# Patient Record
Sex: Male | Born: 2000
Health system: Southern US, Community
[De-identification: ages and names within clinical notes are randomized; demographics above are authoritative.]

## PROBLEM LIST (undated history)

## (undated) DIAGNOSIS — Z9109 Other allergy status, other than to drugs and biological substances: Secondary | ICD-10-CM

## (undated) DIAGNOSIS — K5190 Ulcerative colitis, unspecified, without complications: Secondary | ICD-10-CM

## (undated) DIAGNOSIS — J45909 Unspecified asthma, uncomplicated: Secondary | ICD-10-CM

## (undated) DIAGNOSIS — F32A Depression, unspecified: Secondary | ICD-10-CM

## (undated) DIAGNOSIS — K589 Irritable bowel syndrome without diarrhea: Secondary | ICD-10-CM

## (undated) DIAGNOSIS — F429 Obsessive-compulsive disorder, unspecified: Secondary | ICD-10-CM

## (undated) DIAGNOSIS — F329 Major depressive disorder, single episode, unspecified: Secondary | ICD-10-CM

## (undated) DIAGNOSIS — K200 Eosinophilic esophagitis: Secondary | ICD-10-CM

## (undated) DIAGNOSIS — F419 Anxiety disorder, unspecified: Secondary | ICD-10-CM

## (undated) DIAGNOSIS — M419 Scoliosis, unspecified: Secondary | ICD-10-CM

## (undated) HISTORY — PX: COLONOSCOPY, ESOPHAGOGASTRODUODENOSCOPY (EGD) AND ESOPHAGEAL DILATION: SHX5781

## (undated) HISTORY — DX: Eosinophilic esophagitis: K20.0

---
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
## 2022-01-01 ENCOUNTER — Telehealth: Payer: Self-pay

## 2022-01-01 ENCOUNTER — Encounter: Payer: Self-pay | Admitting: Gastroenterology

## 2022-01-01 LAB — CBC WITH DIFFERENTIAL/PLATELET
Basophils Absolute: 0 10*3/uL (ref 0.0–0.2)
Basos: 0 %
EOS (ABSOLUTE): 0.1 10*3/uL (ref 0.0–0.4)
Eos: 1 %
Hematocrit: 46 % (ref 37.5–51.0)
Hemoglobin: 16.4 g/dL (ref 13.0–17.7)
Immature Grans (Abs): 0 10*3/uL (ref 0.0–0.1)
Immature Granulocytes: 0 %
Lymphocytes Absolute: 1.9 10*3/uL (ref 0.7–3.1)
Lymphs: 20 %
MCH: 31.9 pg (ref 26.6–33.0)
MCHC: 35.7 g/dL (ref 31.5–35.7)
MCV: 90 fL (ref 79–97)
Monocytes Absolute: 0.4 10*3/uL (ref 0.1–0.9)
Monocytes: 5 %
Neutrophils Absolute: 6.9 10*3/uL (ref 1.4–7.0)
Neutrophils: 74 %
Platelets: 327 10*3/uL (ref 150–450)
RBC: 5.14 x10E6/uL (ref 4.14–5.80)
RDW: 11.5 % — ABNORMAL LOW (ref 11.6–15.4)
WBC: 9.3 10*3/uL (ref 3.4–10.8)

## 2022-01-01 LAB — QUANTIFERON-TB GOLD PLUS
QuantiFERON Mitogen Value: >10 IU/mL
QuantiFERON Nil Value: 0.05 IU/mL
QuantiFERON TB1 Ag Value: 0.09 IU/mL
QuantiFERON TB2 Ag Value: 0.12 IU/mL
QuantiFERON-TB Gold Plus: NEGATIVE

## 2022-01-01 LAB — HEPATITIS C ANTIBODY: Hep C Virus Ab: NONREACTIVE

## 2022-01-01 LAB — C-REACTIVE PROTEIN: CRP: 1 mg/L (ref 0–10)

## 2022-01-01 LAB — VITAMIN D 1,25 DIHYDROXY
Vitamin D 1, 25 (OH)2 Total: 58 pg/mL
Vitamin D2 1, 25 (OH)2: <10 pg/mL
Vitamin D3 1, 25 (OH)2: 53 pg/mL

## 2022-01-01 LAB — HEPATITIS A ANTIBODY, TOTAL: hep A Total Ab: POSITIVE — AB

## 2022-01-01 LAB — HEPATITIS B SURFACE ANTIGEN: Hepatitis B Surface Ag: NEGATIVE

## 2022-01-01 LAB — HEPATITIS B SURFACE ANTIBODY,QUALITATIVE: Hep B Surface Ab, Qual: NONREACTIVE

## 2022-01-01 LAB — HEPATITIS B E ANTIBODY: Hep B E Ab: NEGATIVE

## 2022-01-01 LAB — HEPATITIS B CORE ANTIBODY, TOTAL: Hep B Core Total Ab: NEGATIVE

## 2022-01-01 LAB — HIV ANTIBODY (ROUTINE TESTING W REFLEX): HIV Screen 4th Generation wRfx: NONREACTIVE

## 2022-01-01 LAB — HEPATITIS B E ANTIGEN: Hep B E Ag: NEGATIVE

## 2022-01-01 NOTE — Telephone Encounter (Signed)
-----   Message from Jonathon Bellows, MD sent at 01/01/2022 10:51 AM EDT ----- ?Needs hep b vaccine ?

## 2022-01-01 NOTE — Telephone Encounter (Signed)
Sent mychart message

## 2022-01-01 NOTE — Progress Notes (Signed)
Ok

## 2022-01-06 ENCOUNTER — Telehealth: Payer: Self-pay | Admitting: Gastroenterology

## 2022-01-06 NOTE — Telephone Encounter (Signed)
Patient calling to schedule colonoscopy. Requesting call back.  

## 2022-01-07 ENCOUNTER — Ambulatory Visit (HOSPITAL_BASED_OUTPATIENT_CLINIC_OR_DEPARTMENT_OTHER)
Admission: RE | Admit: 2022-01-07 | Discharge: 2022-01-07 | Disposition: A | Discharge status: Home / Self Care | Payer: 59 | Source: Ambulatory Visit | Attending: Gastroenterology | Admitting: Gastroenterology

## 2022-01-07 ENCOUNTER — Other Ambulatory Visit: Payer: Self-pay

## 2022-01-07 ENCOUNTER — Inpatient Hospital Stay (HOSPITAL_BASED_OUTPATIENT_CLINIC_OR_DEPARTMENT_OTHER): Admission: RE | Admit: 2022-01-07 | Payer: 59 | Source: Ambulatory Visit

## 2022-01-07 DIAGNOSIS — Z7952 Long term (current) use of systemic steroids: Secondary | ICD-10-CM | POA: Insufficient documentation

## 2022-01-07 DIAGNOSIS — Z8719 Personal history of other diseases of the digestive system: Secondary | ICD-10-CM | POA: Insufficient documentation

## 2022-01-07 DIAGNOSIS — Z13820 Encounter for screening for osteoporosis: Secondary | ICD-10-CM | POA: Diagnosis not present

## 2022-01-07 DIAGNOSIS — M8588 Other specified disorders of bone density and structure, other site: Secondary | ICD-10-CM | POA: Diagnosis not present

## 2022-01-08 ENCOUNTER — Other Ambulatory Visit: Payer: Self-pay

## 2022-01-08 ENCOUNTER — Other Ambulatory Visit (HOSPITAL_COMMUNITY): Payer: Self-pay

## 2022-01-08 ENCOUNTER — Telehealth: Payer: Self-pay

## 2022-01-08 DIAGNOSIS — Z8719 Personal history of other diseases of the digestive system: Secondary | ICD-10-CM

## 2022-01-08 MED ORDER — NA SULFATE-K SULFATE-MG SULF 17.5-3.13-1.6 GM/177ML PO SOLN
354.0000 mL | Freq: Once | ORAL | 0 refills | Status: AC
  Filled 2022-01-08: qty 354, 1d supply, fill #0

## 2022-01-08 NOTE — Telephone Encounter (Signed)
Called patient back and schedule colonoscopy with Dr. Vicente Males on 01/20/22. Went over instruction, sent to [employer], and mailed them. Sent prep to the pharmacy. Patient states he will get Hepatitis B vaccine a PCP  ?

## 2022-01-08 NOTE — Addendum Note (Signed)
Addended by: Ulyess Blossom L on: 01/08/2022 10:11 AM ? ? Modules accepted: Orders ? ?

## 2022-01-08 NOTE — Telephone Encounter (Signed)
Informed patient by mychart  ?

## 2022-01-08 NOTE — Progress Notes (Signed)
Normal

## 2022-01-08 NOTE — Telephone Encounter (Signed)
-----   Message from Jonathon Bellows, MD sent at 01/08/2022 12:18 PM EDT ----- ?Normal  ?

## 2022-01-19 ENCOUNTER — Other Ambulatory Visit (HOSPITAL_COMMUNITY): Payer: Self-pay

## 2022-01-20 ENCOUNTER — Encounter
Admission: RE | Disposition: A | Discharge status: Home / Self Care | Payer: Self-pay | Source: Home / Self Care | Attending: Gastroenterology

## 2022-01-20 ENCOUNTER — Ambulatory Visit: Payer: 59 | Admitting: Anesthesiology

## 2022-01-20 ENCOUNTER — Ambulatory Visit
Admission: RE | Admit: 2022-01-20 | Discharge: 2022-01-20 | Disposition: A | Discharge status: Home / Self Care | Payer: 59 | Attending: Gastroenterology | Admitting: Gastroenterology

## 2022-01-20 ENCOUNTER — Encounter: Payer: Self-pay | Admitting: Gastroenterology

## 2022-01-20 DIAGNOSIS — M419 Scoliosis, unspecified: Secondary | ICD-10-CM | POA: Insufficient documentation

## 2022-01-20 DIAGNOSIS — D122 Benign neoplasm of ascending colon: Secondary | ICD-10-CM | POA: Insufficient documentation

## 2022-01-20 DIAGNOSIS — K635 Polyp of colon: Secondary | ICD-10-CM

## 2022-01-20 DIAGNOSIS — J45909 Unspecified asthma, uncomplicated: Secondary | ICD-10-CM | POA: Diagnosis not present

## 2022-01-20 DIAGNOSIS — Z8719 Personal history of other diseases of the digestive system: Secondary | ICD-10-CM | POA: Diagnosis not present

## 2022-01-20 DIAGNOSIS — R609 Edema, unspecified: Secondary | ICD-10-CM | POA: Diagnosis not present

## 2022-01-20 DIAGNOSIS — K51919 Ulcerative colitis, unspecified with unspecified complications: Secondary | ICD-10-CM | POA: Diagnosis not present

## 2022-01-20 DIAGNOSIS — F418 Other specified anxiety disorders: Secondary | ICD-10-CM | POA: Diagnosis not present

## 2022-01-20 DIAGNOSIS — Z09 Encounter for follow-up examination after completed treatment for conditions other than malignant neoplasm: Secondary | ICD-10-CM | POA: Insufficient documentation

## 2022-01-20 HISTORY — PX: COLONOSCOPY WITH PROPOFOL: SHX5780

## 2022-01-20 SURGERY — COLONOSCOPY WITH PROPOFOL
Anesthesia: General

## 2022-01-20 MED ORDER — PROPOFOL 500 MG/50ML IV EMUL
INTRAVENOUS | Status: DC | PRN
  Administered 2022-01-20: 200 ug/kg/min via INTRAVENOUS

## 2022-01-20 MED ORDER — GLYCOPYRROLATE 0.2 MG/ML IJ SOLN
INTRAMUSCULAR | Status: DC | PRN
  Administered 2022-01-20 (×2): .1 mg via INTRAVENOUS

## 2022-01-20 MED ORDER — LIDOCAINE HCL (CARDIAC) PF 100 MG/5ML IV SOSY
PREFILLED_SYRINGE | INTRAVENOUS | Status: DC | PRN
Start: 2022-01-20 — End: 2022-01-20
  Administered 2022-01-20: 100 mg via INTRAVENOUS

## 2022-01-20 MED ORDER — DEXMEDETOMIDINE HCL 200 MCG/2ML IV SOLN
INTRAVENOUS | Status: DC | PRN
Start: 2022-01-20 — End: 2022-01-20
  Administered 2022-01-20: 20 ug via INTRAVENOUS

## 2022-01-20 MED ORDER — PHENYLEPHRINE HCL (PRESSORS) 10 MG/ML IV SOLN
INTRAVENOUS | Status: DC | PRN
Start: 2022-01-20 — End: 2022-01-20
  Administered 2022-01-20 (×2): 160 ug via INTRAVENOUS

## 2022-01-20 MED ORDER — SODIUM CHLORIDE 0.9 % IV SOLN: INTRAVENOUS | Status: DC

## 2022-01-20 MED ORDER — PROPOFOL 10 MG/ML IV BOLUS
INTRAVENOUS | Status: DC | PRN
  Administered 2022-01-20: 10 mg via INTRAVENOUS
  Administered 2022-01-20: 20 mg via INTRAVENOUS
  Administered 2022-01-20: 90 mg via INTRAVENOUS

## 2022-01-20 NOTE — Transfer of Care (Signed)
Immediate Anesthesia Transfer of Care Note ? ?Patient: Christopher Clarke ? ?Procedure(s) Performed: COLONOSCOPY WITH PROPOFOL ? ?Patient Location: Endoscopy Unit ? ?Anesthesia Type:General ? ?Level of Consciousness: drowsy ? ?Airway & Oxygen Therapy: Patient Spontanous Breathing ? ?Post-op Assessment: Report given to RN and Post -op Vital signs reviewed and stable ? ?Post vital signs: Reviewed and stable ? ?Last Vitals:  ?Vitals Value Taken Time  ?BP 95/58 01/20/22 0912  ?Temp    ?Pulse 88 01/20/22 0912  ?Resp 31 01/20/22 0912  ?SpO2 98 % 01/20/22 0912  ?Vitals shown include unvalidated device data. ? ?Last Pain:  ?Vitals:  ? 01/20/22 0801  ?TempSrc: Temporal  ?PainSc: 0-No pain  ?   ? ?  ? ?Complications: No notable events documented. ?

## 2022-01-20 NOTE — H&P (Signed)
? ? ? ?Jonathon Bellows, MD ?[address] [place], [place], [place], [address] ?[address], [place], [place], [place], [address] ?Phone: [phone]  ?Fax: [phone] ? ?Primary Care Physician:  Pcp, No ? ? ?Pre-Procedure History & Physical: ?HPI:  Christopher Clarke is a 21 y.o. male is here for an colonoscopy. ?  ?Past Medical History:  ?Diagnosis Date  ? Anxiety   ? Asthma   ? Depression   ? Environmental allergies   ? IBS (irritable bowel syndrome)   ? OCD (obsessive compulsive disorder)   ? Scoliosis   ? Ulcerative colitis ([place])   ? ? ?Past Surgical History:  ?Procedure Laterality Date  ? COLONOSCOPY, ESOPHAGOGASTRODUODENOSCOPY (EGD) AND ESOPHAGEAL DILATION    ? ? ?Prior to Admission medications   ?Medication Sig Start Date End Date Taking? Authorizing Provider  ?albuterol (PROVENTIL HFA;VENTOLIN HFA) 108 (90 BASE) MCG/ACT inhaler Inhale 1 puff into the lungs every 6 (six) hours as needed for wheezing or shortness of breath.    [provider]  ?azaTHIOprine (IMURAN) 50 MG tablet Take 50 mg by mouth daily.    [provider]  ?erythromycin ophthalmic ointment Apply 1 inch into right lower eyelid at bedtime for 1 week 12/15/21     ?escitalopram (LEXAPRO) 10 MG tablet Take 15 mg by mouth daily.    [provider]  ?FLUoxetine (PROZAC) 20 MG capsule TAKE 1 CAPSULE BY MOUTH EVERY MORNING FOR DEPRESSION/ANXIETY 12/10/20 12/23/21  Stephannie Peters, [place]  ?folic acid (FOLVITE) 1 MG tablet Take 1 tablet by mouth daily. 12/02/20   [provider]  ?lansoprazole (PREVACID) 30 MG capsule Take 30 mg by mouth daily at 12 noon.    [provider]  ?mesalamine (CANASA) 1000 MG suppository Unwrap and insert 1 suppository (1,000 mg total) rectally at bedtime for 30 days. Must be seen before any further refills can be authorized 09/15/21     ?Mesalamine 800 MG TBEC Take by mouth 3 (three) times daily.    [provider]  ?methylPREDNISolone (MEDROL DOSEPAK) 4 MG TBPK tablet follow  package directions 02/13/21     ?mometasone (NASONEX) 50 MCG/ACT nasal spray Place 2 sprays into the nose daily.    [provider]  ?montelukast (SINGULAIR) 5 MG chewable tablet Chew 5 mg by mouth daily. 06/04/15   [provider]  ?ofloxacin (OCUFLOX) 0.3 % ophthalmic solution Place 1 drop into the right eye 4 (four) times daily for 4 days 12/15/21     ?Omega 3 1000 MG CAPS Take 2 g by mouth daily.    [provider]  ? ? ?Allergies as of 01/08/2022 - Review Complete 12/23/2021  ?Allergen Reaction Noted  ? Gluten meal  09/15/2016  ? Lac bovis  09/15/2016  ? Milk-related compounds  01/02/2018  ? Other  10/25/2020  ? Shellfish-derived products  09/15/2021  ? Soy allergy  09/15/2021  ? ? ?History reviewed. No pertinent family history. ? ?Social History  ? ?Socioeconomic History  ? Marital status: Single  ?  Spouse name: Not on file  ? Number of children: Not on file  ? Years of education: Not on file  ? Highest education level: Not on file  ?Occupational History  ? Not on file  ?Tobacco Use  ? Smoking status: Never  ? Smokeless tobacco: Never  ?Vaping Use  ? Vaping Use: Never used  ?Substance and Sexual Activity  ? Alcohol use: Never  ? Drug use: Never  ? Sexual activity: Not on file  ?  Other Topics Concern  ? Not on file  ?Social History Narrative  ? Not on file  ? ?Social Determinants of Health  ? ?Financial Resource Strain: Not on file  ?Food Insecurity: Not on file  ?Transportation Needs: Not on file  ?Physical Activity: Not on file  ?Stress: Not on file  ?Social Connections: Not on file  ?Intimate Partner Violence: Not on file  ? ? ?Review of Systems: ?See HPI, otherwise negative ROS ? ?Physical Exam: ?BP 114/76   Pulse 86   Temp (!) 97.5 ?F (36.4 ?C) (Temporal)   Resp 18   Ht 5' 8.75" (1.746 m)   Wt 64.4 kg   SpO2 100%   BMI 21.12 kg/m?  ?General:   Alert,  pleasant and cooperative in NAD ?Head:  Normocephalic and atraumatic. ?Neck:  Supple; no masses or thyromegaly. ?Lungs:  Clear  throughout to auscultation, normal respiratory effort.    ?Heart:  +S1, +S2, Regular rate and rhythm, No edema. ?Abdomen:  Soft, nontender and nondistended. Normal bowel sounds, without guarding, and without rebound.   ?Neurologic:  Alert and  oriented x4;  grossly normal neurologically. ? ?Impression/Plan: ?Christopher Clarke is here for an colonoscopy to be performed for ulcerative colitis.  ?Risks, benefits, limitations, and alternatives regarding  colonoscopy have been reviewed with the patient.  Questions have been answered.  All parties agreeable. ? ? ?Jonathon Bellows, MD  01/20/2022, 8:42 AM ? ?

## 2022-01-20 NOTE — Op Note (Signed)
[facility] ?Gastroenterology ?Patient Name: Christopher Clarke ?Procedure Date: 01/20/2022 8:45 AM ?MRN: [id number] ?Account #: [id number] ?Date of Birth: 02/14/2001 ?Admit Type: Outpatient ?Age: 21 ?Room: [facility] ENDO ROOM 2 ?Gender: Male ?Note Status: Finalized ?Instrument Name: Colonoscope [id number] ?Procedure:             Colonoscopy ?Indications:           Follow-up of ulcerative colitis ?Providers:             Jonathon Bellows MD, MD ?Referring MD:          [facility] (Referring MD) ?Medicines:             Monitored Anesthesia Care ?Complications:         No immediate complications. ?Procedure:             Pre-Anesthesia Assessment: ?                       - Prior to the procedure, a History and Physical was  ?                       performed, and patient medications, allergies and  ?                       sensitivities were reviewed. The patient's tolerance  ?                       of previous anesthesia was reviewed. ?                       - The risks and benefits of the procedure and the  ?                       sedation options and risks were discussed with the  ?                       patient. All questions were answered and informed  ?                       consent was obtained. ?                       - ASA Grade Assessment: II - A patient with mild  ?                       systemic disease. ?                       After obtaining informed consent, the colonoscope was  ?                       passed under direct vision. Throughout the procedure,  ?                       the patient's blood pressure, pulse, and oxygen  ?                       saturations were monitored continuously. The  ?                       Colonoscope was introduced  through the anus and  ?                       advanced to the the cecum, identified by the  ?                       appendiceal orifice. The colonoscopy was performed  ?                       with ease. The patient tolerated the procedure well.  ?                        The quality of the bowel preparation was fair. ?Findings: ?     The perianal and digital rectal examinations were normal. ?     A 8 mm polyp was found in the ascending colon. The polyp was sessile.  ?     The polyp was removed with a cold snare. Resection and retrieval were  ?     complete. ?     Localized moderate inflammation characterized by congestion (edema) and  ?     erythema was found in the distal rectum. Biopsies were taken with a cold  ?     forceps for histology. ?     The exam was otherwise without abnormality on direct and retroflexion  ?     views. ?Impression:            - Preparation of the colon was fair. ?                       - One 8 mm polyp in the ascending colon, removed with  ?                       a cold snare. Resected and retrieved. ?                       - Localized moderate inflammation was found in the  ?                       distal rectum secondary to colitis. Biopsied. ?                       - The examination was otherwise normal on direct and  ?                       retroflexion views. ?Recommendation:        - Discharge patient to home (with escort). ?                       - Resume previous diet. ?                       - Continue present medications. ?                       - Await pathology results. ?                       - Return to my office in 4 weeks. ?Procedure Code(s):     --- Professional --- ?  45385, Colonoscopy, flexible; with removal of  ?                       tumor(s), polyp(s), or other lesion(s) by snare  ?                       technique ?                       45380, 59, Colonoscopy, flexible; with biopsy, single  ?                       or multiple ?Diagnosis Code(s):     --- Professional --- ?                       K63.5, Polyp of colon ?                       K52.9, Noninfective gastroenteritis and colitis,  ?                       unspecified ?                       K51.90, Ulcerative colitis, unspecified, without  ?                        complications ?CPT copyright 2019 American Medical Association. All rights reserved. ?The codes documented in this report are preliminary and upon coder review may  ?be revised to meet current compliance requirements. ?Jonathon Bellows, MD ?Jonathon Bellows MD, MD ?01/20/2022 9:11:18 AM ?This report has been signed electronically. ?Number of Addenda: 0 ?Note Initiated On: 01/20/2022 8:45 AM ?Scope Withdrawal Time: 0 hours 10 minutes 43 seconds  ?Total Procedure Duration: 0 hours 14 minutes 46 seconds  ?Estimated Blood Loss:  Estimated blood loss: none. ?     [facility] ?

## 2022-01-20 NOTE — Anesthesia Postprocedure Evaluation (Signed)
Anesthesia Post Note ? ?Patient: Christopher Clarke ? ?Procedure(s) Performed: COLONOSCOPY WITH PROPOFOL ? ?Patient location during evaluation: Endoscopy ?Anesthesia Type: General ?Level of consciousness: awake and alert ?Pain management: pain level controlled ?Vital Signs Assessment: post-procedure vital signs reviewed and stable ?Respiratory status: spontaneous breathing, nonlabored ventilation, respiratory function stable and patient connected to nasal cannula oxygen ?Cardiovascular status: blood pressure returned to baseline and stable ?Postop Assessment: no apparent nausea or vomiting ?Anesthetic complications: no ? ? ?No notable events documented. ? ? ?Last Vitals:  ?Vitals:  ? 01/20/22 0930 01/20/22 0940  ?BP: (!) 98/58 97/75  ?Pulse: 60 67  ?Resp: 17 18  ?Temp:    ?SpO2: 100% 100%  ?  ?Last Pain:  ?Vitals:  ? 01/20/22 0910  ?TempSrc: Temporal  ?PainSc:   ? ? ?  ?  ?  ?  ?  ?  ? ?Martha Clan ? ? ? ? ?

## 2022-01-20 NOTE — Anesthesia Preprocedure Evaluation (Signed)
Anesthesia Evaluation  ?Patient identified by MRN, date of birth, ID band ?Patient awake ? ? ? ?Reviewed: ?Allergy & Precautions, H&P , NPO status , Patient's Chart, lab work & pertinent test results, reviewed documented beta blocker date and time  ? ?History of Anesthesia Complications ?Negative for: history of anesthetic complications ? ?Airway ?Mallampati: II ? ?TM Distance: >3 FB ?Neck ROM: full ? ? ? Dental ? ?(+) Dental Advidsory Given, Chipped, Teeth Intact ?  ?Pulmonary ?neg shortness of breath, asthma , neg recent URI,  ?  ?Pulmonary exam normal ?breath sounds clear to auscultation ? ? ? ? ? ? Cardiovascular ?Exercise Tolerance: Good ?negative cardio ROS ?Normal cardiovascular exam ?Rhythm:regular Rate:Normal ? ? ?  ?Neuro/Psych ?PSYCHIATRIC DISORDERS Anxiety Depression negative neurological ROS ?   ? GI/Hepatic ?Neg liver ROS, Ulcerative colitis ?  ?Endo/Other  ?negative endocrine ROS ? Renal/GU ?negative Renal ROS  ?negative genitourinary ?  ?Musculoskeletal ? ? Abdominal ?  ?Peds ? Hematology ?negative hematology ROS ?(+)   ?Anesthesia Other Findings ?Past Medical History: ?No date: Anxiety ?No date: Asthma ?No date: Depression ?No date: Environmental allergies ?No date: IBS (irritable bowel syndrome) ?No date: OCD (obsessive compulsive disorder) ?No date: Scoliosis ?No date: Ulcerative colitis ([place]) ? ? Reproductive/Obstetrics ?negative OB ROS ? ?  ? ? ? ? ? ? ? ? ? ? ? ? ? ?  ?  ? ? ? ? ? ? ? ? ?Anesthesia Physical ?Anesthesia Plan ? ?ASA: 2 ? ?Anesthesia Plan: General  ? ?Post-op Pain Management:   ? ?Induction: Intravenous ? ?PONV Risk Score and Plan: 2 and Propofol infusion and TIVA ? ?Airway Management Planned: Natural Airway and Nasal Cannula ? ?Additional Equipment:  ? ?Intra-op Plan:  ? ?Post-operative Plan:  ? ?Informed Consent: I have reviewed the patients History and Physical, chart, labs and discussed the procedure including the risks, benefits and  alternatives for the proposed anesthesia with the patient or authorized representative who has indicated his/her understanding and acceptance.  ? ? ? ?Dental Advisory Given ? ?Plan Discussed with: Anesthesiologist, CRNA and Surgeon ? ?Anesthesia Plan Comments:   ? ? ? ? ? ? ?Anesthesia Quick Evaluation ? ?

## 2022-01-21 ENCOUNTER — Encounter: Payer: Self-pay | Admitting: Gastroenterology

## 2022-01-21 LAB — SURGICAL PATHOLOGY

## 2022-01-23 ENCOUNTER — Telehealth: Payer: Self-pay

## 2022-01-23 NOTE — Telephone Encounter (Signed)
Called patient to let him know the below information and he agreed. ?

## 2022-01-23 NOTE — Progress Notes (Signed)
Polyp was adenoma- repeat colonoscopy in 7 years. Rectal bx shows colitis in remission- continue present treatment and followup with me as previously scheduled

## 2022-01-23 NOTE — Telephone Encounter (Signed)
-----   Message from Jonathon Bellows, MD sent at 01/23/2022  8:29 AM EDT ----- ?Polyp was adenoma- repeat colonoscopy in 7 years. Rectal bx shows colitis in remission- continue present treatment and followup with me as previously scheduled  ?

## 2022-02-12 ENCOUNTER — Other Ambulatory Visit: Payer: 59

## 2022-02-14 ENCOUNTER — Other Ambulatory Visit (HOSPITAL_COMMUNITY): Payer: Self-pay

## 2022-02-16 ENCOUNTER — Other Ambulatory Visit (HOSPITAL_COMMUNITY): Payer: Self-pay

## 2022-02-17 ENCOUNTER — Other Ambulatory Visit (HOSPITAL_COMMUNITY): Payer: Self-pay

## 2022-03-23 ENCOUNTER — Telehealth: Payer: 59 | Admitting: Gastroenterology

## 2022-05-27 ENCOUNTER — Other Ambulatory Visit (HOSPITAL_COMMUNITY): Payer: Self-pay

## 2022-05-28 ENCOUNTER — Other Ambulatory Visit (HOSPITAL_COMMUNITY): Payer: Self-pay

## 2022-06-05 ENCOUNTER — Other Ambulatory Visit (HOSPITAL_COMMUNITY): Payer: Self-pay

## 2022-06-05 ENCOUNTER — Encounter (HOSPITAL_COMMUNITY): Payer: Self-pay | Admitting: Pharmacist

## 2022-06-06 ENCOUNTER — Other Ambulatory Visit (HOSPITAL_COMMUNITY): Payer: Self-pay

## 2022-06-10 ENCOUNTER — Other Ambulatory Visit (HOSPITAL_COMMUNITY): Payer: Self-pay

## 2022-06-27 ENCOUNTER — Ambulatory Visit (INDEPENDENT_AMBULATORY_CARE_PROVIDER_SITE_OTHER): Payer: 59

## 2022-06-27 ENCOUNTER — Ambulatory Visit
Admission: RE | Admit: 2022-06-27 | Discharge: 2022-06-27 | Disposition: A | Discharge status: Home / Self Care | Payer: 59 | Source: Ambulatory Visit | Attending: Family Medicine | Admitting: Family Medicine

## 2022-06-27 ENCOUNTER — Ambulatory Visit: Payer: 59

## 2022-06-27 VITALS — BP 104/70 | HR 65 | Temp 98.7°F | Resp 18 | Ht 69.0 in | Wt 145.0 lb

## 2022-06-27 DIAGNOSIS — Z8719 Personal history of other diseases of the digestive system: Secondary | ICD-10-CM

## 2022-06-27 DIAGNOSIS — R079 Chest pain, unspecified: Secondary | ICD-10-CM

## 2022-06-27 DIAGNOSIS — R0789 Other chest pain: Secondary | ICD-10-CM | POA: Diagnosis not present

## 2022-06-27 DIAGNOSIS — K5190 Ulcerative colitis, unspecified, without complications: Secondary | ICD-10-CM

## 2022-06-27 NOTE — ED Provider Notes (Signed)
[facility]    CSN: [id number] Arrival date & time: 06/27/22  1452      History   Chief Complaint Chief Complaint  Patient presents with   Heartburn    Mild periodic chest pain that has been occurring for two weeks. - Entered by patient   Appointment    HPI Christopher Clarke is a 21 y.o. male.   HPI  Patient is here for chest pain.  Is been going on for 2 weeks.  It happens randomly throughout the day.  Usually in the afternoon but it happened this morning.  He does not have any associated symptoms, no radiation of pain, no dizziness, no shortness of breath, no jaw pain.  He states that he has never had any heart problems.  He has no hypertension, diabetes, high cholesterol, or family history of heart disease.  Remotely he had a couple uncles that had heart attacks he does not recall their age as being particularly young (under 72).  He does not have any symptoms with exercise. Patient states that he does have a fair amount of stress.  He wonders whether this could be anxiety Patient also states that he does have a history of esophagitis.  Was diagnosed with eosinophilic esophagitis at the age of 63.  Was treated with Prevacid.  He has known ulcerative colitis.  He sees GI medicine every 6 months and has colonoscopy once a year.  He is currently treated with mesalamine suppositories daily with good control of symptoms  Past Medical History:  Diagnosis Date   Anxiety    Asthma    Depression    Environmental allergies    IBS (irritable bowel syndrome)    OCD (obsessive compulsive disorder)    Scoliosis    Ulcerative colitis [facility])     Patient Active Problem List   Diagnosis Date Noted   Acne vulgaris 02/10/2021   Colitis 07/08/2016   Ulcerative colitis without complications ([place]) 28/41/3244   EE (eosinophilic esophagitis) 09/30/7251   Allergy 08/21/2015   Asthma 08/21/2015   Joint pain 08/21/2015   Chronic diarrhea 08/16/2015   Hematochezia 08/16/2015   Lower  abdominal pain 08/16/2015   Microcytic anemia 08/16/2015    Past Surgical History:  Procedure Laterality Date   COLONOSCOPY WITH PROPOFOL N/A 01/20/2022   Procedure: COLONOSCOPY WITH PROPOFOL;  Surgeon: Jonathon Bellows, MD;  Location: [facility] ENDOSCOPY;  Service: Gastroenterology;  Laterality: N/A;   COLONOSCOPY, ESOPHAGOGASTRODUODENOSCOPY (EGD) AND ESOPHAGEAL DILATION         Home Medications    Prior to Admission medications   Medication Sig Start Date End Date Taking? Authorizing Provider  albuterol (PROVENTIL HFA;VENTOLIN HFA) 108 (90 BASE) MCG/ACT inhaler Inhale 1 puff into the lungs every 6 (six) hours as needed for wheezing or shortness of breath.   Yes [provider]  folic acid (FOLVITE) 1 MG tablet Take 1 tablet by mouth daily. 12/02/20  Yes [provider]  mesalamine (CANASA) 1000 MG suppository Unwrap and insert 1 suppository (1,000 mg total) rectally at bedtime . 09/15/21  Yes   mometasone (NASONEX) 50 MCG/ACT nasal spray Place 2 sprays into the nose daily.   Yes [provider]  Omega 3 1000 MG CAPS Take 2 g by mouth daily.   Yes [provider]    Family History History reviewed. No pertinent family history.  Social History Social History   Tobacco Use   Smoking status: Never   Smokeless tobacco: Never  Vaping Use   Vaping  Use: Never used  Substance Use Topics   Alcohol use: Never   Drug use: Never     Allergies   Gluten meal, Milk (cow), Milk-related compounds, Other, Shellfish-derived products, and Soy allergy   Review of Systems Review of Systems See HPI  Physical Exam Triage Vital Signs ED Triage Vitals  Enc Vitals Group     BP 06/27/22 1516 104/70     Pulse Rate 06/27/22 1516 65     Resp 06/27/22 1516 18     Temp 06/27/22 1516 98.7 F (37.1 C)     Temp Source 06/27/22 1516 Oral     SpO2 06/27/22 1516 100 %     Weight 06/27/22 1517 145 lb (65.8 kg)     Height 06/27/22 1517 5' 9"  (1.753 m)     Head  Circumference --      Peak Flow --      Pain Score 06/27/22 1517 0     Pain Loc --      Pain Edu? --      Excl. in [place]? --    No data found.  Updated Vital Signs BP 104/70 (BP Location: Right Arm)   Pulse 65   Temp 98.7 F (37.1 C) (Oral)   Resp 18   Ht 5' 9"  (1.753 m)   Wt 65.8 kg   SpO2 100%   BMI 21.41 kg/m       Physical Exam Constitutional:      General: He is not in acute distress.    Appearance: He is well-developed.  HENT:     Head: Normocephalic and atraumatic.  Eyes:     Conjunctiva/sclera: Conjunctivae normal.     Pupils: Pupils are equal, round, and reactive to light.  Cardiovascular:     Rate and Rhythm: Normal rate and regular rhythm.     Heart sounds: Normal heart sounds.  Pulmonary:     Effort: Pulmonary effort is normal. No respiratory distress.     Breath sounds: Normal breath sounds.  Chest:     Chest wall: No tenderness.  Abdominal:     General: There is no distension.     Palpations: Abdomen is soft.     Tenderness: There is abdominal tenderness.     Comments: Minimal tenderness to deep palpation in the midepigastrium  Musculoskeletal:        General: Normal range of motion.     Cervical back: Normal range of motion.  Skin:    General: Skin is warm and dry.  Neurological:     Mental Status: He is alert.      UC Treatments / Results  Labs (all labs ordered are listed, but only abnormal results are displayed) Labs Reviewed - No data to display  EKG   Radiology DG Chest 2 View  Result Date: 06/27/2022 CLINICAL DATA:  Chest pain for the past 2 weeks. EXAM: CHEST - 2 VIEW COMPARISON:  01/02/2018 FINDINGS: Normal cardiac silhouette and mediastinal contours. No focal parenchymal opacities. No pleural effusion or pneumothorax. No evidence of edema. No acute osseous abnormalities. IMPRESSION: No acute cardiopulmonary disease. Electronically Signed   By: Sandi Mariscal M.D.   On: 06/27/2022 16:00    Procedures ED EKG  Date/Time: 06/27/2022  4:14 PM  Performed by: Raylene Everts, MD Authorized by: Raylene Everts, MD   Previous ECG:    Previous ECG:  Unavailable Interpretation:    Interpretation: normal   Rate:    ECG rate:  60   ECG rate  assessment: normal   Rhythm:    Rhythm: sinus rhythm   Ectopy:    Ectopy: none   QRS:    QRS axis:  Normal   QRS intervals:  Normal   QRS conduction: normal   ST segments:    ST segments:  Normal T waves:    T waves: normal   Q waves:    Abnormal Q-waves: not present   Comments:     Normal EKG.  Discussed with patient  (including critical care time)  Medications Ordered in UC Medications - No data to display  Initial Impression / Assessment and Plan / UC Course  I have reviewed the triage vital signs and the nursing notes.  Pertinent labs & imaging results that were available during my care of the patient were reviewed by me and considered in my medical decision making (see chart for details).    Reviewed that I do not feel is chest pain is cardiogenic.  I think that it is likely from stress.  He had minimal tenderness to deep palpation of the midepigastrium but not enough for me to definitively identify this is GI.  I told him if he has GI distress he could take a couple of weeks of over-the-counter omeprazole.  Discussed finding a primary care doctor for ongoing medical care Final Clinical Impressions(s) / UC Diagnoses   Final diagnoses:  Non-cardiac chest pain  Ulcerative colitis without complications, unspecified location [facility])  History of esophagitis     Discharge Instructions      Try to get into sleep, eat well, and get enough sleep Return here or go to emergency room if you start feeling worse    ED Prescriptions   None    PDMP not reviewed this encounter.   Raylene Everts, MD 06/27/22 [phone]

## 2022-06-27 NOTE — ED Triage Notes (Signed)
Patient c/o on and off chest pain x 2 weeks.  Denies any SOB, nausea or vomiting.  Patient denies any OTC meds.  Denies any cardiac history.

## 2022-06-27 NOTE — Discharge Instructions (Addendum)
Try to get into sleep, eat well, and get enough sleep Return here or go to emergency room if you start feeling worse

## 2022-06-28 ENCOUNTER — Telehealth: Payer: Self-pay | Admitting: Emergency Medicine

## 2022-06-28 NOTE — Telephone Encounter (Signed)
Attempted to contact patient, unable to leave a message due to mailbox being full.

## 2022-09-14 ENCOUNTER — Other Ambulatory Visit: Payer: Self-pay

## 2022-09-15 ENCOUNTER — Other Ambulatory Visit (HOSPITAL_COMMUNITY): Payer: Self-pay

## 2022-09-15 ENCOUNTER — Other Ambulatory Visit: Payer: Self-pay

## 2022-10-30 ENCOUNTER — Other Ambulatory Visit: Payer: Self-pay

## 2022-10-30 ENCOUNTER — Other Ambulatory Visit (HOSPITAL_COMMUNITY): Payer: Self-pay

## 2022-10-30 MED ORDER — MESALAMINE 1000 MG RE SUPP
1000.0000 mg | Freq: Every day | RECTAL | 1 refills | Status: DC
  Filled 2022-10-30: qty 90, fill #0
  Filled 2022-11-01 – 2023-09-29 (×4): qty 90, 90d supply, fill #0

## 2022-11-02 ENCOUNTER — Other Ambulatory Visit (HOSPITAL_COMMUNITY): Payer: Self-pay

## 2022-11-02 ENCOUNTER — Other Ambulatory Visit: Payer: Self-pay

## 2022-11-27 ENCOUNTER — Ambulatory Visit: Payer: Commercial Managed Care - PPO | Admitting: Medical

## 2022-11-27 VITALS — BP 110/66 | HR 68 | Temp 98.0°F | Resp 18 | Ht 70.0 in | Wt 141.0 lb

## 2022-11-27 DIAGNOSIS — J3089 Other allergic rhinitis: Secondary | ICD-10-CM

## 2022-11-27 DIAGNOSIS — J4520 Mild intermittent asthma, uncomplicated: Secondary | ICD-10-CM | POA: Diagnosis not present

## 2022-11-27 DIAGNOSIS — K5190 Ulcerative colitis, unspecified, without complications: Secondary | ICD-10-CM | POA: Diagnosis not present

## 2022-11-27 NOTE — Progress Notes (Signed)
Subjective:    Patient ID: Christopher Clarke, male    DOB: 06/02/2001, 22 y.o.   MRN: [id number]  HPI  Pt in for first time.  Pt states [place]. 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  Regular exercise at gym. Some cardio and weights. Eating moderate healthy.(Girlfriend opinion). Nonsmoker. No alcohol. Occasional sodda intermittently.   Pt has hx of UC. Pt states he gets checked up every 6 months. He states his condition is stable. He states he is off on Fridays but current GI is off on Fridays so he needs new GI MD.  On review last GI note A/P 11-2021.  ssessment and Plan:    Christopher Clarke is a 22 y.o. y/o male has been referred for transfer of care for ulcerative colitis which was initially ulcerative pancolitis been diagnosed at the age of 47 was on Imuran and Lialda for some years and later transferred to only Canasa suppository.  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  Unclear why stepdown therapy was approached despite having active inflammation.     Plan 1.  CMP, CBC, CRP, fecal calprotectin 2.  Colonoscopy with biopsies in all parts of the colon to determine endoscopic status of ulcerative colitis. 3.  If having active disease will need stepping up of therapy and would favor monotherapy with a biologic agent. 4.  Annual skin exam not been done recently we will request 1 since he had been on Imuran for a while history of ulcerative colitis 5.  DEXA bone scan due to history of chronic steroid use 6.  Will address immunization at next visit     Pt did have colonoscopy recently as well.   Impression/Plan: Christopher Clarke is here for an colonoscopy to be performed for ulcerative colitis.  Risks, benefits, limitations, and alternatives regarding  colonoscopy have been reviewed with the patient.  Questions have been answered.  All parties agreeable.  Post procedure pt summarizes told condition conrolled and continue medication.  Follow up in 6 months. Pt is on canasa 1 suppository at bedtime.     Also hx of asthma- last flare 10 year ago. Has active inhaler.  Pt has hx of depression and anxiety. He states stable now. Was on medication about 5 years ago.   Seasonal allergies- year round. He states mild symptoms and typically does not have to take meds.    Review of Systems  Constitutional:  Negative for chills, fatigue and fever.  Respiratory:  Negative for cough, chest tightness, shortness of breath and wheezing.   Cardiovascular:  Negative for chest pain and palpitations.  Gastrointestinal:  Negative for abdominal pain, blood in stool, diarrhea, nausea, rectal pain and vomiting.  Musculoskeletal:  Negative for back pain, gait problem, myalgias and neck pain.  Skin:  Negative for pallor and rash.  Psychiatric/Behavioral:  Negative for behavioral problems and confusion.     Past Medical History:  Diagnosis Date   Anxiety    Asthma    Depression    Environmental allergies    IBS (irritable bowel syndrome)    OCD (obsessive compulsive disorder)    Scoliosis    Ulcerative colitis ([place])      Social History   Socioeconomic History   Marital status: Single    Spouse name: Not on file   Number of children: Not on file   Years of education: Not on file   Highest education level: Not on file  Occupational History  Not on file  Tobacco Use   Smoking status: Never   Smokeless tobacco: Never  Vaping Use   Vaping Use: Never used  Substance and Sexual Activity   Alcohol use: Never   Drug use: Never   Sexual activity: Not on file  Other Topics Concern   Not on file  Social History Narrative   Not on file   Social Determinants of Health   Financial Resource Strain: Not on file  Food Insecurity: Not on file  Transportation Needs: Not on file  Physical Activity: Not on file  Stress: Not on file  Social Connections: Not on file  Intimate Partner Violence: Not on file    Past Surgical  History:  Procedure Laterality Date   COLONOSCOPY WITH PROPOFOL N/A 01/20/2022   Procedure: COLONOSCOPY WITH PROPOFOL;  Surgeon: Jonathon Bellows, MD;  Location: [facility] ENDOSCOPY;  Service: Gastroenterology;  Laterality: N/A;   COLONOSCOPY, ESOPHAGOGASTRODUODENOSCOPY (EGD) AND ESOPHAGEAL DILATION      No family history on file.  Allergies  Allergen Reactions   Gluten Meal     Other reaction(s): GI Upset (intolerance)   Milk (Cow)     Other reaction(s): Other (See Comments) Swelling (EOE)   Milk-Related Compounds    Other     Other reaction(s): Other (See Comments) Pt stated that he allergic to all preservatives   Shellfish-Derived Products     Other reaction(s): GI Upset (intolerance)   Soy Allergy     Other reaction(s): GI Upset (intolerance)    Current Outpatient Medications on File Prior to Visit  Medication Sig Dispense Refill   albuterol (PROVENTIL HFA;VENTOLIN HFA) 108 (90 BASE) MCG/ACT inhaler Inhale 1 puff into the lungs every 6 (six) hours as needed for wheezing or shortness of breath.     folic acid (FOLVITE) 1 MG tablet Take 1 tablet by mouth daily.     mesalamine (CANASA) 1000 MG suppository Unwrap and insert 1 suppository (1,000 mg total) rectally at bedtime . 90 suppository 1   mometasone (NASONEX) 50 MCG/ACT nasal spray Place 2 sprays into the nose daily.     Omega 3 1000 MG CAPS Take 2 g by mouth daily.     No current facility-administered medications on file prior to visit.    BP 110/66   Pulse 68   Temp 98 F (36.7 C)   Resp 18   Ht '5\' 10"'$  (1.778 m)   Wt 141 lb (64 kg)   SpO2 97%   BMI 20.23 kg/m        Objective:   Physical Exam   General Mental Status- Alert. General Appearance- Not in acute distress.   Skin General: Color- Normal Color. Moisture- Normal Moisture.  Neck Carotid Arteries- Normal color. Moisture- Normal Moisture. No carotid bruits. No JVD.  Chest and Lung Exam Auscultation: Breath  Sounds:-Normal.  Cardiovascular Auscultation:Rythm- Regular. Murmurs & Other Heart Sounds:Auscultation of the heart reveals- No Murmurs.  Abdomen Inspection:-Inspeection Normal. Palpation/Percussion:Note:No mass. Palpation and Percussion of the abdomen reveal- Non Tender, Non Distended + BS, no rebound or guarding.  Neurologic Cranial Nerve exam:- CN III-XII intact(No nystagmus), symmetric smile. Strength:- 5/5 equal and symmetric strength both upper and lower extremities.      Assessment & Plan:   Patient Instructions  Ulcerative colitis. Stable with recent colonoscopy. I placed referral to new GI MD. Recommend you call them next week as follow up.  Asthma- flares are very rare. You have inhaler. If flares become more frequent let me now.  Some seasonal allergies(mild  reported not presently)- if significant flares recommend xyzal anithistamine otc and flonase nasal spray.  Follow up once a year wellness exam or sooner if needed.     Mackie Pai PA-C

## 2022-11-27 NOTE — Patient Instructions (Addendum)
Ulcerative colitis. Stable with recent colonoscopy. I placed referral to new GI MD. Recommend you call them next week as follow up.  Asthma- flares are very rare. You have inhaler. If flares become more frequent let me now.  Some seasonal allergies(mild reported not presently)- if significant flares recommend xyzal anithistamine otc and flonase nasal spray.  Follow up once a year wellness exam or sooner if needed.

## 2022-12-26 ENCOUNTER — Telehealth: Payer: Commercial Managed Care - PPO | Admitting: Nurse Practitioner

## 2022-12-26 DIAGNOSIS — J019 Acute sinusitis, unspecified: Secondary | ICD-10-CM

## 2022-12-27 MED ORDER — DOXYCYCLINE HYCLATE 100 MG PO TABS: 100.0000 mg | ORAL_TABLET | Freq: Two times a day (BID) | ORAL | 0 refills | Status: DC

## 2022-12-27 MED ORDER — FLUTICASONE PROPIONATE 50 MCG/ACT NA SUSP: 2.0000 | Freq: Every day | NASAL | 0 refills | Status: DC

## 2022-12-27 MED ORDER — PSEUDOEPH-BROMPHEN-DM 30-2-10 MG/5ML PO SYRP: 5.0000 mL | ORAL_SOLUTION | Freq: Four times a day (QID) | ORAL | 0 refills | Status: DC | PRN

## 2022-12-27 NOTE — Progress Notes (Signed)
I have spent 5 minutes in review of e-visit questionnaire, review and updating patient chart, medical decision making and response to patient.  ° °Tamaj Jurgens W Cola Highfill, NP ° °  °

## 2022-12-27 NOTE — Progress Notes (Signed)

## 2022-12-28 ENCOUNTER — Other Ambulatory Visit: Payer: Self-pay

## 2022-12-28 ENCOUNTER — Other Ambulatory Visit: Payer: Self-pay | Admitting: Medical

## 2022-12-29 ENCOUNTER — Other Ambulatory Visit: Payer: Self-pay

## 2022-12-29 ENCOUNTER — Other Ambulatory Visit (HOSPITAL_COMMUNITY): Payer: Self-pay

## 2022-12-29 MED ORDER — ALBUTEROL SULFATE HFA 108 (90 BASE) MCG/ACT IN AERS
1.0000 | INHALATION_SPRAY | Freq: Four times a day (QID) | RESPIRATORY_TRACT | 5 refills | Status: DC | PRN
  Filled 2022-12-29: qty 6.7, 50d supply, fill #0

## 2022-12-31 ENCOUNTER — Other Ambulatory Visit (HOSPITAL_COMMUNITY): Payer: Self-pay

## 2022-12-31 ENCOUNTER — Encounter (HOSPITAL_COMMUNITY): Payer: Self-pay

## 2023-01-01 ENCOUNTER — Other Ambulatory Visit: Payer: Self-pay

## 2023-01-01 ENCOUNTER — Other Ambulatory Visit (HOSPITAL_COMMUNITY): Payer: Self-pay

## 2023-01-01 ENCOUNTER — Ambulatory Visit (INDEPENDENT_AMBULATORY_CARE_PROVIDER_SITE_OTHER): Payer: Commercial Managed Care - PPO

## 2023-01-01 ENCOUNTER — Telehealth: Payer: Self-pay | Admitting: Gastroenterology

## 2023-01-01 ENCOUNTER — Ambulatory Visit
Admission: RE | Admit: 2023-01-01 | Discharge: 2023-01-01 | Disposition: A | Discharge status: Home / Self Care | Payer: Commercial Managed Care - PPO | Source: Ambulatory Visit | Attending: Family Medicine | Admitting: Family Medicine

## 2023-01-01 VITALS — BP 114/70 | HR 103 | Temp 98.1°F | Resp 14

## 2023-01-01 DIAGNOSIS — R059 Cough, unspecified: Secondary | ICD-10-CM

## 2023-01-01 DIAGNOSIS — R053 Chronic cough: Secondary | ICD-10-CM | POA: Diagnosis not present

## 2023-01-01 DIAGNOSIS — J189 Pneumonia, unspecified organism: Secondary | ICD-10-CM | POA: Diagnosis not present

## 2023-01-01 MED ORDER — BENZONATATE 200 MG PO CAPS
200.0000 mg | ORAL_CAPSULE | Freq: Three times a day (TID) | ORAL | 0 refills | Status: AC | PRN
  Filled 2023-01-01: qty 40, 14d supply, fill #0

## 2023-01-01 MED ORDER — AMOXICILLIN-POT CLAVULANATE 875-125 MG PO TABS
1.0000 | ORAL_TABLET | Freq: Two times a day (BID) | ORAL | 0 refills | Status: AC
  Filled 2023-01-01: qty 20, 10d supply, fill #0

## 2023-01-01 MED ORDER — AZITHROMYCIN 250 MG PO TABS
250.0000 mg | ORAL_TABLET | Freq: Every day | ORAL | 0 refills | Status: DC
  Filled 2023-01-01: qty 6, 5d supply, fill #0

## 2023-01-01 MED ORDER — HYDROCODONE BIT-HOMATROP MBR 5-1.5 MG/5ML PO SOLN
5.0000 mL | Freq: Four times a day (QID) | ORAL | 0 refills | Status: DC | PRN
  Filled 2023-01-01: qty 120, 6d supply, fill #0

## 2023-01-01 NOTE — Telephone Encounter (Signed)
Good Afternoon Dr. Barron Alvine,  Supervising Provider 4/5 PM  We have received a referral for this patient to be seen at our office by Esperanza Richters, PA for ulcerative colitis without complications. Patient have previous history with [facility], states that his only day off is Friday and he provider there is not able to see him on Fridays. Records are available to view in Epic, please review them at your earliest convenience and advise on scheduling.  Thank You!

## 2023-01-01 NOTE — Discharge Instructions (Addendum)
Advised patient of chest x-ray results and suspected left lower lobe pneumonia.  Instructed patient to discontinue Doxycycline and Bromfed-DM cough syrup now.  Advised patient to take medications as directed with food to completion.  Advised may take Tessalon daily or as needed for cough.  Advised may take Hycodan at night prior to sleep for cough due to sedative effects.  Advised patient not to take cough medications together.  Instructed patient to increase daily water intake to 64 ounces per day while taking these medications.  Advised patient to repeat chest x-ray on or about 01/31/2023 to ensure left lower lobe pneumonia has fully resolved.

## 2023-01-01 NOTE — ED Provider Notes (Signed)
[facility]    CSN: [id number] Arrival date & time: 01/01/23  1552      History   Chief Complaint Chief Complaint  Patient presents with   Cough    Persistent cough for 3 weeks - Entered by patient    HPI Christopher Clarke is a 22 y.o. male.   HPI 22 year old male presents with cough for 3 weeks.  Reports was treated by provider on ED visit on 12/26/2022 was prescribed doxycycline twice daily for 10 days for sinusitis.  Patient denies fever; however, reports persistent cough for 3 weeks has not resolved. PMH significant for asthma, depression, and IBS.  Past Medical History:  Diagnosis Date   Anxiety    Asthma    Depression    Environmental allergies    IBS (irritable bowel syndrome)    OCD (obsessive compulsive disorder)    Scoliosis    Ulcerative colitis     Patient Active Problem List   Diagnosis Date Noted   Acne vulgaris 02/10/2021   Colitis 07/08/2016   Ulcerative colitis without complications 09/12/2015   EE (eosinophilic esophagitis) 09/12/2015   Allergy 08/21/2015   Asthma 08/21/2015   Joint pain 08/21/2015   Chronic diarrhea 08/16/2015   Hematochezia 08/16/2015   Lower abdominal pain 08/16/2015   Microcytic anemia 08/16/2015    Past Surgical History:  Procedure Laterality Date   COLONOSCOPY WITH PROPOFOL N/A 01/20/2022   Procedure: COLONOSCOPY WITH PROPOFOL;  Surgeon: Wyline Mood, MD;  Location: [facility] ENDOSCOPY;  Service: Gastroenterology;  Laterality: N/A;   COLONOSCOPY, ESOPHAGOGASTRODUODENOSCOPY (EGD) AND ESOPHAGEAL DILATION         Home Medications    Prior to Admission medications   Medication Sig Start Date End Date Taking? Authorizing Provider  amoxicillin-clavulanate (AUGMENTIN) 875-125 MG tablet Take 1 tablet by mouth 2 (two) times daily for 10 days. 01/01/23 01/11/23 Yes Trevor Iha, FNP  azithromycin (ZITHROMAX) 250 MG tablet Take first 2 tablets together on day 1, then 1 tablet every day days 2 through 5 until finished. 01/01/23   Yes Trevor Iha, FNP  benzonatate (TESSALON) 200 MG capsule Take 1 capsule (200 mg total) by mouth 3 (three) times daily as needed for up to 7 days. 01/01/23 01/15/23 Yes Trevor Iha, FNP  HYDROcodone bit-homatropine (HYCODAN) 5-1.5 MG/5ML syrup Take 5 mLs by mouth every 6 (six) hours as needed for cough. 01/01/23  Yes Trevor Iha, FNP  albuterol (VENTOLIN HFA) 108 (90 Base) MCG/ACT inhaler Inhale 1 puff into the lungs every 6 (six) hours as needed for wheezing or shortness of breath. 12/29/22   Saguier, Ramon Dredge, PA-C  fluticasone (FLONASE) 50 MCG/ACT nasal spray Place 2 sprays into both nostrils daily. 12/27/22   Claiborne Rigg, NP  folic acid (FOLVITE) 1 MG tablet Take 1 tablet by mouth daily. 12/02/20   [provider]  mesalamine (CANASA) 1000 MG suppository Unwrap and insert 1 suppository (1,000 mg total) rectally at bedtime . 10/30/22   Wyline Mood, MD  mometasone (NASONEX) 50 MCG/ACT nasal spray Place 2 sprays into the nose daily.    [provider]  Omega 3 1000 MG CAPS Take 2 g by mouth daily.    [provider]    Family History History reviewed. No pertinent family history.  Social History Social History   Tobacco Use   Smoking status: Never   Smokeless tobacco: Never  Vaping Use   Vaping Use: Never used  Substance Use Topics   Alcohol use: Never   Drug use: Never  Allergies   Gluten meal, Milk (cow), Milk-related compounds, Other, Shellfish-derived products, and Soy allergy   Review of Systems Review of Systems  Respiratory:  Positive for cough.   All other systems reviewed and are negative.    Physical Exam Triage Vital Signs ED Triage Vitals  Enc Vitals Group     BP      Pulse      Resp      Temp      Temp src      SpO2      Weight      Height      Head Circumference      Peak Flow      Pain Score      Pain Loc      Pain Edu?      Excl. in GC?    No data found.  Updated Vital Signs BP 114/70 (BP Location: Right  Arm)   Pulse (!) 103   Temp 98.1 F (36.7 C) (Oral)   Resp 14   SpO2 98%   Visual Acuity Right Eye Distance:   Left Eye Distance:   Bilateral Distance:    Right Eye Near:   Left Eye Near:    Bilateral Near:     Physical Exam Vitals and nursing note reviewed.  Constitutional:      Appearance: Normal appearance. He is normal weight.  HENT:     Head: Normocephalic and atraumatic.     Right Ear: Tympanic membrane, ear canal and external ear normal.     Left Ear: Tympanic membrane, ear canal and external ear normal.     Mouth/Throat:     Mouth: Mucous membranes are moist.     Pharynx: Oropharynx is clear.  Eyes:     Extraocular Movements: Extraocular movements intact.     Conjunctiva/sclera: Conjunctivae normal.     Pupils: Pupils are equal, round, and reactive to light.  Cardiovascular:     Rate and Rhythm: Normal rate and regular rhythm.     Pulses: Normal pulses.     Heart sounds: Normal heart sounds.  Pulmonary:     Effort: Pulmonary effort is normal.     Breath sounds: No wheezing, rhonchi or rales.     Comments: Diminished breath sounds noted throughout frequent nonproductive cough on exam Musculoskeletal:        General: Normal range of motion.     Cervical back: Normal range of motion and neck supple.  Skin:    General: Skin is warm and dry.  Neurological:     General: No focal deficit present.     Mental Status: He is alert and oriented to person, place, and time. Mental status is at baseline.      UC Treatments / Results  Labs (all labs ordered are listed, but only abnormal results are displayed) Labs Reviewed - No data to display  EKG   Radiology DG Chest 2 View  Result Date: 01/01/2023 CLINICAL DATA:  Cough for 3 weeks EXAM: CHEST - 2 VIEW COMPARISON:  06/27/2022 FINDINGS: Minimal patchy nodular left lower lung opacity suspicious for mild left lower lobe pneumonia. Right lung remains clear. Negative for edema, significant collapse or consolidation.  No large effusion or pneumothorax. Trachea midline. Normal heart size and vascularity. Minor upper thoracic scoliosis. IMPRESSION: Suspect mild left lower lobe pneumonia. Electronically Signed   By: Judie PetitM.  Shick M.D.   On: 01/01/2023 16:50    Procedures Procedures (including critical care time)  Medications Ordered in  UC Medications - No data to display  Initial Impression / Assessment and Plan / UC Course  I have reviewed the triage vital signs and the nursing notes.  Pertinent labs & imaging results that were available during my care of the patient were reviewed by me and considered in my medical decision making (see chart for details).     MDM: 1. Community acquired pneumonia of left lower lob of lung-Rx'd Augmentin 875/125 mg twice daily x 10 days, Zithromax 500 mg day 1, then 250 mg daily x 4 days; 2. Cough, unspecified-Rx Tessalon 200 mg 3 times daily, as needed, Hycodan 5-1.5 mg per 5 mL syrup-take 5 mL every 6 hours for cough. Advised patient of chest x-ray results and suspected left lower lobe pneumonia.  Instructed patient to discontinue Doxycycline and Bromfed-DM cough syrup now.  Advised patient to take medications as directed with food to completion.  Advised may take Tessalon daily or as needed for cough.  Advised may take Hycodan at night prior to sleep for cough due to sedative effects.  Advised patient not to take cough medications together.  Instructed patient to increase daily water intake to 64 ounces per day while taking these medications.  Advised patient to repeat chest x-ray on or about 01/31/2023 to ensure left lower lobe pneumonia has fully resolved.  Patient discharged home, hemodynamically stable. Final Clinical Impressions(s) / UC Diagnoses   Final diagnoses:  Community acquired pneumonia of left lower lobe of lung  Cough, unspecified type     Discharge Instructions      Advised patient of chest x-ray results and suspected left lower lobe pneumonia.  Instructed  patient to discontinue Doxycycline and Bromfed-DM cough syrup now.  Advised patient to take medications as directed with food to completion.  Advised may take Tessalon daily or as needed for cough.  Advised may take Hycodan at night prior to sleep for cough due to sedative effects.  Advised patient not to take cough medications together.  Instructed patient to increase daily water intake to 64 ounces per day while taking these medications.  Advised patient to repeat chest x-ray on or about 01/31/2023 to ensure left lower lobe pneumonia has fully resolved.     ED Prescriptions     Medication Sig Dispense Auth. Provider   amoxicillin-clavulanate (AUGMENTIN) 875-125 MG tablet Take 1 tablet by mouth 2 (two) times daily for 10 days. 20 tablet Trevor Ihaagan, Courteny Egler, FNP   azithromycin (ZITHROMAX) 250 MG tablet Take first 2 tablets together on day 1, then 1 tablet every day days 2 through 5 until finished. 6 tablet Trevor Ihaagan, Kindall Swaby, FNP   benzonatate (TESSALON) 200 MG capsule Take 1 capsule (200 mg total) by mouth 3 (three) times daily as needed for up to 7 days. 40 capsule Trevor Ihaagan, Cecily Lawhorne, FNP   HYDROcodone bit-homatropine (HYCODAN) 5-1.5 MG/5ML syrup Take 5 mLs by mouth every 6 (six) hours as needed for cough. 120 mL Trevor Ihaagan, Kollyn Lingafelter, FNP      I have reviewed the PDMP during this encounter.   Trevor IhaRagan, Destinae Neubecker, FNP 01/01/23 1740

## 2023-01-01 NOTE — ED Triage Notes (Signed)
Pt presents with c/o cough x 3 weeks. 

## 2023-01-01 NOTE — Telephone Encounter (Signed)
Chart reviewed.  Has been followed by Christopher Clarke, but looks like Christopher Clarke does not see patients in the office on Fridays and patient only able to get to appointments on Fridays.  If Christopher Clarke is unable to transfer to 1 of Christopher Clarke partners to accommodate his need to be seen on Fridays, ok to transfer his care to [place].

## 2023-01-02 ENCOUNTER — Telehealth: Payer: Self-pay | Admitting: Emergency Medicine

## 2023-01-02 NOTE — Telephone Encounter (Signed)
Call to see how Christopher Clarke was today. No questions or concerns about the visit. Pt states he has started the meds prescribed. RN encourage Christopher Clarke to rest and hydrate over the weekend

## 2023-01-04 NOTE — Telephone Encounter (Signed)
Attempted to reach patient to schedule appt, unable to LVM

## 2023-01-06 NOTE — Telephone Encounter (Signed)
Attempted to reach patient again, VM full.

## 2023-01-15 ENCOUNTER — Ambulatory Visit: Payer: Commercial Managed Care - PPO | Admitting: Medical

## 2023-01-21 NOTE — Telephone Encounter (Signed)
LM on vmail to call back

## 2023-01-22 ENCOUNTER — Encounter: Payer: Self-pay | Admitting: Gastroenterology

## 2023-03-26 ENCOUNTER — Other Ambulatory Visit (HOSPITAL_COMMUNITY): Payer: Self-pay

## 2023-03-26 ENCOUNTER — Other Ambulatory Visit: Payer: Self-pay

## 2023-03-30 ENCOUNTER — Other Ambulatory Visit (HOSPITAL_COMMUNITY): Payer: Self-pay

## 2023-04-08 ENCOUNTER — Other Ambulatory Visit: Payer: Self-pay

## 2023-04-23 ENCOUNTER — Other Ambulatory Visit (HOSPITAL_COMMUNITY): Payer: Self-pay

## 2023-04-23 ENCOUNTER — Ambulatory Visit: Payer: Commercial Managed Care - PPO | Admitting: Gastroenterology

## 2023-04-23 ENCOUNTER — Other Ambulatory Visit: Payer: Commercial Managed Care - PPO

## 2023-04-23 ENCOUNTER — Ambulatory Visit: Payer: Commercial Managed Care - PPO | Admitting: Nurse Practitioner

## 2023-04-23 ENCOUNTER — Encounter: Payer: Self-pay | Admitting: Nurse Practitioner

## 2023-04-23 VITALS — BP 100/70 | HR 85 | Ht 70.0 in | Wt 137.0 lb

## 2023-04-23 DIAGNOSIS — K200 Eosinophilic esophagitis: Secondary | ICD-10-CM

## 2023-04-23 DIAGNOSIS — K5190 Ulcerative colitis, unspecified, without complications: Secondary | ICD-10-CM

## 2023-04-23 LAB — CBC WITH DIFFERENTIAL/PLATELET
Basophils Absolute: 0.1 10*3/uL (ref 0.0–0.1)
Basophils Relative: 1.2 % (ref 0.0–3.0)
Eosinophils Absolute: 0.4 10*3/uL (ref 0.0–0.7)
Eosinophils Relative: 6.6 % — ABNORMAL HIGH (ref 0.0–5.0)
HCT: 45.7 % (ref 39.0–52.0)
Hemoglobin: 15.1 g/dL (ref 13.0–17.0)
Lymphocytes Relative: 33.5 % (ref 12.0–46.0)
Lymphs Abs: 2.2 10*3/uL (ref 0.7–4.0)
MCHC: 33 g/dL (ref 30.0–36.0)
MCV: 91.3 fl (ref 78.0–100.0)
Monocytes Absolute: 0.4 10*3/uL (ref 0.1–1.0)
Monocytes Relative: 6.7 % (ref 3.0–12.0)
Neutro Abs: 3.3 10*3/uL (ref 1.4–7.7)
Neutrophils Relative %: 52 % (ref 43.0–77.0)
Platelets: 277 10*3/uL (ref 150.0–400.0)
RBC: 5.01 Mil/uL (ref 4.22–5.81)
RDW: 12.8 % (ref 11.5–14.6)
WBC: 6.4 10*3/uL (ref 4.5–10.5)

## 2023-04-23 LAB — VITAMIN D 25 HYDROXY (VIT D DEFICIENCY, FRACTURES): VITD: 39.32 ng/mL (ref 30.00–100.00)

## 2023-04-23 LAB — COMPREHENSIVE METABOLIC PANEL
ALT: 24 U/L (ref 0–53)
AST: 33 U/L (ref 0–37)
Albumin: 4.8 g/dL (ref 3.5–5.2)
Alkaline Phosphatase: 63 U/L (ref 39–117)
BUN: 13 mg/dL (ref 6–23)
CO2: 29 mEq/L (ref 19–32)
Calcium: 9.7 mg/dL (ref 8.4–10.5)
Chloride: 102 mEq/L (ref 96–112)
Creatinine, Ser: 1 mg/dL (ref 0.40–1.50)
GFR: 108.46 mL/min (ref >60.00)
Glucose, Bld: 93 mg/dL (ref 70–99)
Potassium: 3.7 mEq/L (ref 3.5–5.1)
Sodium: 139 mEq/L (ref 135–145)
Total Bilirubin: 0.6 mg/dL (ref 0.2–1.2)
Total Protein: 7.4 g/dL (ref 6.0–8.3)

## 2023-04-23 LAB — C-REACTIVE PROTEIN: CRP: <1 mg/dL (ref 0.5–20.0)

## 2023-04-23 MED ORDER — MESALAMINE 1000 MG RE SUPP
1000.0000 mg | Freq: Every day | RECTAL | 1 refills | Status: DC
  Filled 2023-04-23 – 2023-06-04 (×2): qty 90, 90d supply, fill #0
  Filled 2023-09-29: qty 90, 90d supply, fill #1

## 2023-04-23 NOTE — Patient Instructions (Addendum)
Your provider has requested that you go to the basement level for lab work before leaving today. Press "B" on the elevator. The lab is located at the first door on the left as you exit the elevator.  We have sent the following medications to your pharmacy for you to pick up at your convenience: Canasa suppositories  Purchase over the counter: Prevacid 15 mg- take 1 by mouth daily as needed  Due to recent changes in healthcare laws, you may see the results of your imaging and laboratory studies on MyChart before your provider has had a chance to review them.  We understand that in some cases there may be results that are confusing or concerning to you. Not all laboratory results come back in the same time frame and the provider may be waiting for multiple results in order to interpret others.  Please give [place] 48 hours in order for your provider to thoroughly review all the results before contacting the office for clarification of your results.   Thank you for trusting me with your gastrointestinal care!   Alcide Evener, CRNP

## 2023-04-23 NOTE — Progress Notes (Signed)
04/23/2023 Youssef Pricilla Handler [id number] 01-02-2001   CHIEF COMPLAINT: Ulcerative colitis   HISTORY OF PRESENT ILLNESS: Rondey A. Sohal is a 22 year old male with a past medical history of anxiety, depression, scoliosis, asthma, eosinophilic esophagitis and ulcerative colitis initially diagnosed at the age of 47 per colonoscopy at [facility]. He was previously on Imuran and Lialda until 2022 then subsequently transitioned to only Canasa suppository. Previously followed by Dr. Wyline Mood. He presents to our office today as referred by Esperanza Richters PA-C due to further evaluation regardign ulcerative colitis. He wishes to transition his GI management with [facility] gastroenterology as he needed Friday appointment which were not available at Dr. Johnney Killian office.  He denies having any nausea or vomiting.  No recent dysphagia.  History of GERD and eosinophilic esophagitis per EGD in 2016, initially treated with Prevacid.  Negative food allergy panel.  He typically has dysphagia only if he eats cold cheese or whole milk.  He has occasional heartburn which occurs every 2 to 3 weeks.  He stopped taking Prevacid 4 years ago.  He has occasional periumbilical pain.  He is passing a normal formed brown bowel meant once daily.  He only sees blood on the toilet tissue if he uses rough toilet paper.  No mucus per the rectum.  No tenesmus.  He re mains on Canasa 1 g suppository PR nightly for the past 4 years.  He maintains a gluten-free diet.  He develops abdominal bloat, gas and diarrhea if he eats any gluten products.  Duodenal biopsies negative for celiac disease per EGD 02/2017.  No known family history of celiac disease or IBD.  His most recent colonoscopy was 01/20/2022 which identified one 8 mm tubular adenomatous polyp removed from the ascending colon and moderate inflammation was found in the distal rectum.  Colon biopsies were negative for active colitis.  Rectal biopsies showed evidence of quiescent colitis.  No NSAID  use.  Appetite is good.  Weight is stable.  He did not receive COVID vaccinations.      Latest Ref Rng & Units 12/23/2021    3:19 PM 01/02/2018    5:16 PM 08/16/2015    9:20 AM  CBC  WBC 3.4 - 10.8 x10E3/uL 9.3   12.6   Hemoglobin 13.0 - 17.7 g/dL 40.9  81.1  9.8   Hematocrit 37.5 - 51.0 % 46.0  43.0  31.0   Platelets 150 - 450 x10E3/uL 327   459        Latest Ref Rng & Units 01/02/2018    5:16 PM 08/16/2015    9:20 AM  CMP  Glucose 65 - 99 mg/dL 84  89   BUN 6 - 20 mg/dL 10  6   Creatinine 9.14 - 1.00 mg/dL 7.82  9.56   Sodium 213 - 145 mmol/L 140  139   Potassium 3.5 - 5.1 mmol/L 3.8  4.3   Chloride 101 - 111 mmol/L 103  108   CO2 22 - 32 mmol/L  25   Calcium 8.9 - 10.3 mg/dL  9.2   Total Protein 6.5 - 8.1 g/dL  6.9   Total Bilirubin 0.3 - 1.2 mg/dL  0.3   Alkaline Phos 42 - 362 U/L  217   AST 15 - 41 U/L  26   ALT 17 - 63 U/L  18     Colonoscopy 01/20/2022 by Dr. Wyline Mood:  - Preparation of the colon was fair.  - One 8 mm polyp  in the ascending colon, removed with a cold snare. Resected and retrieved.  - Localized moderate inflammation was found in the distal rectum secondary to colitis. Biopsied.  - The examination was otherwise normal on direct and retroflexion views. -Recall colonoscopy 7 years  A.  COLON, RIGHT, BIOPSIES: - MULTIPLE FRAGMENTS OF COLONIC MUCOSA WITH MILD NONSPECIFIC EDEMA. - FEATURES OF A SPECIFIC COLITIS ARE NOT IDENTIFIED. - NO SIGNIFICANT ATYPIA OR MALIGNANCY.  B.  COLON, ASCENDING, POLYP; COLD SNARE BIOPSIES: - SESSILE SERRATED POLYP.  C.  COLON, TRANSVERSE;, BIOPSIES: - MULTIPLE FRAGMENTS OF COLONIC MUCOSA WITH MILD NONSPECIFIC EDEMA. - FEATURES OF A SPECIFIC COLITIS ARE NOT IDENTIFIED. - NO SIGNIFICANT ATYPIA OR MALIGNANCY.  D.  COLON, LEFT; BIOPSIES: - COLONIC MUCOSA WITH NO SPECIFIC HISTOLOGIC ABNORMALITY. - -NO SIGNIFICANT ATYPIA OR MALIGNANCY.  E.  RECTUM; BIOPSIES: - RECTAL MUCOSA WITH MILD ARCHITECTURAL DISTORTION (SEE  COMMENT). - NO EVIDENCE OF ACTIVE COLITIS. -NO SIGNIFICANT ATYPIA OR MALIGNANCY.  Comment: The rectal biopsy shows some crypt shortening and crypt dropout- the features are suggestive of quiescent colitis.  Colonoscopy 02/19/2020 [facility]: Showed ascending colon biopsies with no inflammation, transverse colon biopsies with no inflammation, descending colon biopsies no inflammation, sigmoid colon biopsies with no inflammation but rectal biopsy showed moderate chronic active colitis.  EGD 03/09/2017: Procedure report not available  Colonoscopy 03/09/2017: Procedure report not available  A.  DUODENUM, BIOPSY:       Duodenal mucosa with no diagnostic abnormality.       No villous blunting or increased intraepithelial  lymphocytes.   B. STOMACH, BIOPSY:       Mild chronic inactive gastritis.   C.  ESOPHAGUS, DISTAL, BIOPSY:       Squamous mucosa with up to 6 eosinophils per HPF.   D.  ESOPHAGUS, MID, BIOSPY:       Squamous mucosa with no diagnostic abnormality.   E.  TERMINAL ILLEUM, BIOSPY:       Ileal mucosa with no diagnostic abnormality.   F.  COLON, ASCENDING, BIOPSY:       Focal active colitis.       No dysplasia identified.   G.  COLON, TRANSVERSE, BIOPSY:       Colonic mucosa with no diagnostic abnormality.       No dysplasia identified.   H.  COLON, DESCENDING, BIOPSY:       Severe chronic active colitis.       No dysplasia identified.   I.  COLON, SIGMOID, BIOPSY:       Colonic mucosa with no diagnostic abnormality.       No dysplasia identified.   J.  COLON, RECTUM, BIOPSY:       Severe chronic active colitis.       No dysplasia identified.   EGD 08/27/2015: Procedure report unavailable.  Colonoscopy 08/27/2015: Procedure report not available  A.          Duodenum, BIOPSY:       Benign duodenal mucosa.  Negative for dysplasia and malignancy.   B.          Duodenal bulb, BIOPSY:       Benign duodenal mucosa.  Negative for dysplasia and  malignancy   C.          STOMACH, BIOPSY:       Mild chronic inactive gastritis.  Negative for dysplasia and malignancy.  No Helicobacter pylori organisms are identified on H&E stain.   D.  Distal esophagus, BIOPSY:       Squamous mucosa with basal cell hyperplasia, focally  abundant intraepithelial eosinophils.  No intestinal metaplasia, dysplasia or malignancy identified.  See comment. In parts D and E the esophageal squamous mucosa shows focal  increase in intraepithelial lymphocytes (up to 16 per HPF). The  findings are compatible with gastroesophageal reflux; however,  eosinophilic esophagitis is also in consideration.  E.          Proximal esophagus, BIOPSY:       Squamous mucosa with basal cell hyperplasia, focally  abundant intraepithelial eosinophils.  No intestinal metaplasia, dysplasia or malignancy identified.  See comment.   F.          TERIMINAL ILEUM, BIOPSY:       Small bowel mucosa with reactive lymphoid aggregates.  Negative for inflammation, dysplasia and malignancy.   G.          Cecum, BIOPSY:       Benign colonic mucosa.  Negative for inflammation, dysplasia and malignancy.   H.          Ascending colon, BIOPSY:       Focal acute colitis.  Negative for dysplasia and malignancy.  See comment.   I.          Transverse colon, BIOPSY:       Focal acute colitis.  Negative for dysplasia and malignancy.    J.          Descending colon, BIOPSY:       Focal acute colitis.  Negative for dysplasia and malignancy.  See comment.   K.          Rectosigmoid, BIOPSY:       Focal acute colitis.  Negative for dysplasia and malignancy.  See comment.   Past Medical History:  Diagnosis Date   Anxiety    Asthma    Depression    Environmental allergies    IBS (irritable bowel syndrome)    OCD (obsessive compulsive disorder)    Scoliosis    Ulcerative colitis (HCC)    Past Surgical History:  Procedure Laterality Date   COLONOSCOPY WITH PROPOFOL N/A  01/20/2022   Procedure: COLONOSCOPY WITH PROPOFOL;  Surgeon: Wyline Mood, MD;  Location: [facility] ENDOSCOPY;  Service: Gastroenterology;  Laterality: N/A;   COLONOSCOPY, ESOPHAGOGASTRODUODENOSCOPY (EGD) AND ESOPHAGEAL DILATION     Social History: He is single.  He is an Personnel officer.  Non-smoker.  No alcohol use.  No drug use.  Family History: Grandmother had esophagitis.  Mother and father with IBS.  No known family history of IBD, celiac disease or colorectal cancer.  Allergies  Allergen Reactions   Gluten Meal     Other reaction(s): GI Upset (intolerance)   Milk (Cow)     Other reaction(s): Other (See Comments) Swelling (EOE)   Milk-Related Compounds    Other     Other reaction(s): Other (See Comments) Pt stated that he allergic to all preservatives   Shellfish-Derived Products     Other reaction(s): GI Upset (intolerance)   Soy Allergy     Other reaction(s): GI Upset (intolerance)      Outpatient Encounter Medications as of 04/23/2023  Medication Sig   mesalamine (CANASA) 1000 MG suppository Unwrap and insert 1 suppository (1,000 mg total) rectally at bedtime .   albuterol (VENTOLIN HFA) 108 (90 Base) MCG/ACT inhaler Inhale 1 puff into the lungs every 6 (six) hours as needed for wheezing or shortness of breath.   folic acid (FOLVITE) 1 MG tablet  Take 1 tablet by mouth daily. (Patient not taking: Reported on 04/23/2023)   HYDROcodone bit-homatropine (HYCODAN) 5-1.5 MG/5ML syrup Take 5 mLs by mouth every 6 (six) hours as needed for cough. (Patient not taking: Reported on 04/23/2023)   mometasone (NASONEX) 50 MCG/ACT nasal spray Place 2 sprays into the nose daily. (Patient not taking: Reported on 04/23/2023)   Omega 3 1000 MG CAPS Take 2 g by mouth daily. (Patient not taking: Reported on 04/23/2023)   [DISCONTINUED] azithromycin (ZITHROMAX) 250 MG tablet Take first 2 tablets together on day 1, then 1 tablet every day days 2 through 5 until finished.   [DISCONTINUED] fluticasone (FLONASE) 50  MCG/ACT nasal spray Place 2 sprays into both nostrils daily.   No facility-administered encounter medications on file as of 04/23/2023.   REVIEW OF SYSTEMS:  Gen: Denies fever, sweats or chills. No weight loss.  CV: Denies chest pain, palpitations or edema. Resp: Denies cough, shortness of breath of hemoptysis.  GI: See HPI.  GU : Denies urinary burning, blood in urine, increased urinary frequency or incontinence. MS: Denies joint pain, muscles aches or weakness. Derm: Denies rash, itchiness, skin lesions or unhealing ulcers. Psych: Denies depression, anxiety, memory loss or confusion. Heme: Denies bruising, easy bleeding. Neuro:  Denies headaches, dizziness or paresthesias. Endo:  Denies any problems with DM, thyroid or adrenal function.  PHYSICAL EXAM: Ht 5\' 10"  (1.778 m)   Wt 137 lb (62.1 kg)   BMI 19.66 kg/m  General: 22 year old male in no acute distress. Head: Normocephalic and atraumatic. Eyes:  Sclerae non-icteric, conjunctive pink. Ears: Normal auditory acuity. Mouth: Dentition intact. No ulcers or lesions.  Neck: Supple, no lymphadenopathy or thyromegaly.  Lungs: Clear bilaterally to auscultation without wheezes, crackles or rhonchi. Heart: Regular rate and rhythm. No murmur, rub or gallop appreciated.  Abdomen: Soft, nontender, nondistended. No masses. No hepatosplenomegaly. Normoactive bowel sounds x 4 quadrants.  Rectal: Deferred. Musculoskeletal: Symmetrical with no gross deformities. Skin: Warm and dry. No rash or lesions on visible extremities. Extremities: No edema. Neurological: Alert oriented x 4, no focal deficits.  Psychological:  Alert and cooperative. Normal mood and affect.  ASSESSMENT AND PLAN:  22 year old male initially diagnosed with ulcerative colitis at the age of 72 in clinical remission on Canasa 1 g suppository nightly. His most recent colonoscopy was 01/20/2022 which identified one 8 mm sessile serrated polyp removed from the ascending colon and  moderate inflammation was found in the distal rectum.  Colon biopsies were negative for active colitis.  Rectal biopsies showed evidence of quiescent colitis.  -Continue Canasa (Mesalamine) 1 g suppository 1 PR nightly -CBC, CMP, CRP and vitamin D level -Dr. Barron Alvine to verify recall colonoscopy date -Recommend annual flu shot -Follow-up in office with Dr. Barron Alvine in 6 months  History of GERD and eosinophilic esophagitis.  EGD 2016 and 2018 procedure report is unavailable in care everywhere, biopsies reviewed, see results as documented above.  Patient taking Prevacid for years ago.  He has infrequent dysphagia which occurs only if eating cold cheese or drinking whole milk.  Infrequent heartburn. -Patient to contact her office if he develops dysphagia or increased heartburn -Prevacid 15 mg 1 tab p.o. daily as needed  Questionable gluten sensitivity, duodenal biopsies negative for celiac disease.  Develops abdominal bloat, gas and diarrhea if he ingests gluten products.  8 mm sessile serrated polyp removed from the colon per colonoscopy 12/2021 -Dr. Barron Alvine to verify colon polyp surveillance colonoscopy recall date      CC:  Saguier, Ramon Dredge,  PA-C

## 2023-04-30 ENCOUNTER — Telehealth: Payer: Self-pay

## 2023-04-30 NOTE — Telephone Encounter (Signed)
Contacted pt & LVM to return call regarding lab results

## 2023-05-03 ENCOUNTER — Telehealth: Payer: Self-pay

## 2023-05-03 NOTE — Telephone Encounter (Signed)
Contacted pt & LVM to return call 

## 2023-05-06 ENCOUNTER — Other Ambulatory Visit (HOSPITAL_COMMUNITY): Payer: Self-pay

## 2023-05-20 ENCOUNTER — Telehealth: Payer: Commercial Managed Care - PPO | Admitting: Nurse Practitioner

## 2023-05-20 DIAGNOSIS — J069 Acute upper respiratory infection, unspecified: Secondary | ICD-10-CM

## 2023-05-20 MED ORDER — IPRATROPIUM BROMIDE 0.03 % NA SOLN: 2.0000 | Freq: Two times a day (BID) | NASAL | 12 refills | Status: DC

## 2023-05-20 MED ORDER — BENZONATATE 100 MG PO CAPS: 100.0000 mg | ORAL_CAPSULE | Freq: Three times a day (TID) | ORAL | 0 refills | Status: DC | PRN

## 2023-05-20 NOTE — Progress Notes (Signed)
E-Visit for Upper Respiratory Infection   We are sorry you are not feeling well.  Here is how we plan to help!  Your symptoms are consistent with COVID, you may want to consider taking a home test if able.  Sore throats with congestion and cough are typically from post nasal drainage   Based on what you have shared with me, it looks like you may have a viral upper respiratory infection.  Upper respiratory infections are caused by a large number of viruses; however, rhinovirus is the most common cause.   Symptoms vary from person to person, with common symptoms including sore throat, cough, fatigue or lack of energy and feeling of general discomfort.  A low-grade fever of up to 100.4 may present, but is often uncommon.  Symptoms vary however, and are closely related to a person's age or underlying illnesses.  The most common symptoms associated with an upper respiratory infection are nasal discharge or congestion, cough, sneezing, headache and pressure in the ears and face.  These symptoms usually persist for about 3 to 10 days, but can last up to 2 weeks.  It is important to know that upper respiratory infections do not cause serious illness or complications in most cases.    Upper respiratory infections can be transmitted from person to person, with the most common method of transmission being a person's hands.  The virus is able to live on the skin and can infect other persons for up to 2 hours after direct contact.  Also, these can be transmitted when someone coughs or sneezes; thus, it is important to cover the mouth to reduce this risk.  To keep the spread of the illness at bay, good hand hygiene is very important.  This is an infection that is most likely caused by a virus. There are no specific treatments other than to help you with the symptoms until the infection runs its course.  We are sorry you are not feeling well.  Here is how we plan to help!   For nasal congestion, you may use an oral  decongestants such as Mucinex D or if you have glaucoma or high blood pressure use plain Mucinex.  Saline nasal spray or nasal drops can help and can safely be used as often as needed for congestion.  For your congestion, I have prescribed Ipratropium Bromide nasal spray 0.03% two sprays in each nostril 2-3 times a day  If you do not have a history of heart disease, hypertension, diabetes or thyroid disease, prostate/bladder issues or glaucoma, you may also use Sudafed to treat nasal congestion.  It is highly recommended that you consult with a pharmacist or your primary care physician to ensure this medication is safe for you to take.     If you have a cough, you may use cough suppressants such as Delsym and Robitussin.  If you have glaucoma or high blood pressure, you can also use Coricidin HBP.   For cough I have prescribed for you A prescription cough medication called Tessalon Perles 100 mg. You may take 1-2 capsules every 8 hours as needed for cough Also be sure you are using your inhaler, and if you need a refill please let us know.   If you have a sore or scratchy throat, use a saltwater gargle-  to  teaspoon of salt dissolved in a 4-ounce to 8-ounce glass of warm water.  Gargle the solution for approximately 15-30 seconds and then spit.  It is important not to  swallow the solution.  You can also use throat lozenges/cough drops and Chloraseptic spray to help with throat pain or discomfort.  Warm or cold liquids can also be helpful in relieving throat pain.  For headache, pain or general discomfort, you can use Ibuprofen or Tylenol as directed.   Some authorities believe that zinc sprays or the use of Echinacea may shorten the course of your symptoms.   HOME CARE Only take medications as instructed by your medical team. Be sure to drink plenty of fluids. Water is fine as well as fruit juices, sodas and electrolyte beverages. You may want to stay away from caffeine or alcohol. If you are  nauseated, try taking small sips of liquids. How do you know if you are getting enough fluid? Your urine should be a pale yellow or almost colorless. Get rest. Taking a steamy shower or using a humidifier may help nasal congestion and ease sore throat pain. You can place a towel over your head and breathe in the steam from hot water coming from a faucet. Using a saline nasal spray works much the same way. Cough drops, hard candies and sore throat lozenges may ease your cough. Avoid close contacts especially the very young and the elderly Cover your mouth if you cough or sneeze Always remember to wash your hands.   GET HELP RIGHT AWAY IF: You develop worsening fever. If your symptoms do not improve within 10 days You develop yellow or green discharge from your nose over 3 days. You have coughing fits You develop a severe head ache or visual changes. You develop shortness of breath, difficulty breathing or start having chest pain Your symptoms persist after you have completed your treatment plan  MAKE SURE YOU  Understand these instructions. Will watch your condition. Will get help right away if you are not doing well or get worse.  Thank you for choosing an e-visit.  Your e-visit answers were reviewed by a board certified advanced clinical practitioner to complete your personal care plan. Depending upon the condition, your plan could have included both over the counter or prescription medications.  Please review your pharmacy choice. Make sure the pharmacy is open so you can pick up prescription now. If there is a problem, you may contact your provider through [employer] and have the prescription routed to another pharmacy.  Your safety is important to [place]. If you have drug allergies check your prescription carefully.   For the next 24 hours you can use MyChart to ask questions about today's visit, request a non-urgent call back, or ask for a work or school excuse. You will get an  email in the next two days asking about your experience. I hope that your e-visit has been valuable and will speed your recovery.  Meds ordered this encounter  Medications   benzonatate (TESSALON) 100 MG capsule    Sig: Take 1 capsule (100 mg total) by mouth 3 (three) times daily as needed.    Dispense:  30 capsule    Refill:  0   ipratropium (ATROVENT) 0.03 % nasal spray    Sig: Place 2 sprays into both nostrils every 12 (twelve) hours.    Dispense:  30 mL    Refill:  12    I spent approximately 5 minutes reviewing the patient's history, current symptoms and coordinating their care today.

## 2023-05-21 NOTE — Progress Notes (Signed)
Agree with the assessment and plan as outlined by Alcide Evener, NP.   Since his index colonoscopy in 2016 showed focal acute colitis throughout the colon, should initiate surveillance at this time since he is 8 years beyond diagnosis.  Will discuss scheduling for repeat colonoscopy at his follow-up appointment with me and will likely place on 2-year surveillance plans for the time being, per guideline recommendations, which can start in 12/2023.   Doristine Locks, DO, [facility]

## 2023-05-25 ENCOUNTER — Ambulatory Visit (HOSPITAL_BASED_OUTPATIENT_CLINIC_OR_DEPARTMENT_OTHER)
Admission: RE | Admit: 2023-05-25 | Discharge: 2023-05-25 | Disposition: A | Discharge status: Home / Self Care | Payer: Commercial Managed Care - PPO | Source: Ambulatory Visit | Attending: Medical | Admitting: Medical

## 2023-05-25 ENCOUNTER — Other Ambulatory Visit (HOSPITAL_BASED_OUTPATIENT_CLINIC_OR_DEPARTMENT_OTHER): Payer: Self-pay

## 2023-05-25 ENCOUNTER — Other Ambulatory Visit: Payer: Self-pay

## 2023-05-25 ENCOUNTER — Other Ambulatory Visit (HOSPITAL_COMMUNITY): Payer: Self-pay

## 2023-05-25 ENCOUNTER — Ambulatory Visit: Payer: Commercial Managed Care - PPO | Admitting: Medical

## 2023-05-25 ENCOUNTER — Encounter: Payer: Self-pay | Admitting: Medical

## 2023-05-25 VITALS — BP 116/72 | HR 87 | Temp 97.7°F | Resp 20

## 2023-05-25 DIAGNOSIS — R059 Cough, unspecified: Secondary | ICD-10-CM | POA: Insufficient documentation

## 2023-05-25 DIAGNOSIS — R5383 Other fatigue: Secondary | ICD-10-CM

## 2023-05-25 DIAGNOSIS — J3489 Other specified disorders of nose and nasal sinuses: Secondary | ICD-10-CM

## 2023-05-25 DIAGNOSIS — R918 Other nonspecific abnormal finding of lung field: Secondary | ICD-10-CM | POA: Diagnosis not present

## 2023-05-25 LAB — CBC WITH DIFFERENTIAL/PLATELET
Absolute Monocytes: 896 cells/uL (ref 200–950)
Basophils Absolute: 45 cells/uL (ref 0–200)
Basophils Relative: 0.4 %
Eosinophils Absolute: 146 cells/uL (ref 15–500)
Eosinophils Relative: 1.3 %
HCT: 37.7 % — ABNORMAL LOW (ref 38.5–50.0)
Hemoglobin: 13.2 g/dL (ref 13.2–17.1)
Lymphs Abs: 2531 cells/uL (ref 850–3900)
MCH: 31 pg (ref 27.0–33.0)
MCHC: 35 g/dL (ref 32.0–36.0)
MCV: 88.5 fL (ref 80.0–100.0)
MPV: 11.1 fL (ref 7.5–12.5)
Monocytes Relative: 8 %
Neutro Abs: 7582 cells/uL (ref 1500–7800)
Neutrophils Relative %: 67.7 %
Platelets: 331 10*3/uL (ref 140–400)
RBC: 4.26 10*6/uL (ref 4.20–5.80)
RDW: 12.3 % (ref 11.0–15.0)
Total Lymphocyte: 22.6 %
WBC: 11.2 10*3/uL — ABNORMAL HIGH (ref 3.8–10.8)

## 2023-05-25 LAB — POC COVID19 BINAXNOW: SARS Coronavirus 2 Ag: POSITIVE — AB

## 2023-05-25 MED ORDER — AMOXICILLIN-POT CLAVULANATE 875-125 MG PO TABS
1.0000 | ORAL_TABLET | Freq: Two times a day (BID) | ORAL | 0 refills | Status: DC
  Filled 2023-05-25 – 2023-05-26 (×3): qty 20, 10d supply, fill #0

## 2023-05-25 MED ORDER — NIRMATRELVIR/RITONAVIR (PAXLOVID)TABLET
3.0000 | ORAL_TABLET | Freq: Two times a day (BID) | ORAL | 0 refills | Status: AC
Start: 2023-05-25 — End: 2023-05-31
  Filled 2023-05-25 – 2023-05-26 (×2): qty 30, 5d supply, fill #0

## 2023-05-25 MED ORDER — ALBUTEROL SULFATE HFA 108 (90 BASE) MCG/ACT IN AERS
2.0000 | INHALATION_SPRAY | Freq: Four times a day (QID) | RESPIRATORY_TRACT | 5 refills | Status: AC | PRN
  Filled 2023-05-25: qty 6.7, 15d supply, fill #0
  Filled 2023-05-25: qty 6.7, 25d supply, fill #0

## 2023-05-25 MED ORDER — AZITHROMYCIN 250 MG PO TABS
ORAL_TABLET | ORAL | 0 refills | Status: AC
Start: 2023-05-25 — End: 2023-05-30
  Filled 2023-05-25 (×2): qty 6, 5d supply, fill #0

## 2023-05-25 MED ORDER — BENZONATATE 100 MG PO CAPS
100.0000 mg | ORAL_CAPSULE | Freq: Three times a day (TID) | ORAL | 0 refills | Status: DC | PRN
  Filled 2023-05-25: qty 30, 10d supply, fill #0

## 2023-05-25 NOTE — Progress Notes (Signed)
n

## 2023-05-25 NOTE — Patient Instructions (Addendum)
COVID-19 Infection(concern for sinus infection and possible secondary pneumonia) Positive rapid test with symptoms of sinus congestion, cough with yellow phlegm, shortness of breath, and fatigue. History of pneumonia last year. -cbc blood work, and chest x-ray to rule out pneumonia. -Prescribe Paxlovid as an antiviral treatment due to history of asthma.(Risk factor for complication) -Advise patient to rest and not work for the rest of the week. -benzonate for cough - azithromycin antibiotic for sinus infection and lung coverage.  Asthma Stable until infection or allergies, currently exacerbated due to COVID-19 infection. - Rx refill of albuterol. if you find having to use every 4-6 hours let me know'   -Recommend not to work until next Monday. offered work note but declined.   Follow up in 10 days or sooner if needed.

## 2023-05-25 NOTE — Addendum Note (Signed)
Addended by: Thelma Barge D on: 05/25/2023 04:00 PM   Modules accepted: Orders

## 2023-05-25 NOTE — Progress Notes (Signed)
   Subjective:    Patient ID: Christopher Clarke, male    DOB: 2000-11-23, 22 y.o.   MRN: [id number]  HPI Discussed the use of AI scribe software for clinical note transcription with the patient, who gave verbal consent to proceed.  History of Present Illness   The patient, with a history of asthma, initially presented with symptoms of a sinus infection, including sinus pressure and congestion. Over the course of a few days, the symptoms progressed to include increased weakness, fatigue, and productive cough with yellow phlegm. The patient also experienced fever and chills.  The patient's respiratory symptoms worsened, with the onset of shortness of breath and difficulty taking deep breaths without inducing a cough. The patient reported a similar episode last year, which resulted in a pneumonia diagnosis. The patient expressed concern about the current symptoms progressing to pneumonia, Will cough if takes deep breath. No labored breathing. No leg pain.  The patient tested positive for COVID-19 during this illness episode. The patient's asthma typically remains stable until triggered by allergies or infections.          Review of Systems  Constitutional:  Negative for chills and fever.  HENT:  Positive for congestion and sinus pressure.   Respiratory:  Positive for cough and shortness of breath. Negative for chest tightness and wheezing.        See hpi.   Cardiovascular:  Negative for chest pain and palpitations.  Gastrointestinal:  Negative for abdominal pain.  Genitourinary:  Negative for dysuria and frequency.  Musculoskeletal:        See hpi.   Hematological:  Negative for adenopathy.       Objective:   Physical Exam  General Mental Status- Alert. General Appearance- Not in acute distress.   Skin General: Color- Normal Color. Moisture- Normal Moisture.  Neck Carotid Arteries- Normal color. Moisture- Normal Moisture. No carotid bruits. No JVD.  Chest and Lung  Exam Auscultation: Breath Sounds: even and unlabored. Rt lung field thought heard faint rough breath sounds.   Cardiovascular Auscultation:Rythm- Regular. Murmurs & Other Heart Sounds:Auscultation of the heart reveals- No Murmurs.  Abdomen Inspection:-Inspeection Normal. Palpation/Percussion:Note:No mass. Palpation and Percussion of the abdomen reveal- Non Tender, Non Distended + BS, no rebound or guarding.    Neurologic Cranial Nerve exam:- CN III-XII intact(No nystagmus), symmetric smile. Symmetric strength both upper and lower extremities.    Heent- maxillary sinus pressure. Yellow mucus in left nares.     Assessment & Plan:  Assessment and Plan    COVID-19 Infection(concern for sinus infection and possible secondary pneumonia) Positive rapid test with symptoms of sinus congestion, cough with yellow phlegm, shortness of breath, and fatigue. History of pneumonia last year. -cbc blood work, and chest x-ray to rule out pneumonia. -Prescribe Paxlovid as an antiviral treatment due to history of asthma. -Advise patient to rest and not work for the rest of the week. -benzonate for cough - azithromycin antibiotic for sinus infection and lung coverage.  Asthma Stable until infection or allergies, currently exacerbated due to COVID-19 infection. - Rx refill of albuterol. if you find having to use every 4-6 hours let me know'   -Recommend not to work until next Monday. offered work note but declined.   Follow up in 10 days or sooner if needed.   Esperanza Richters, PA-C

## 2023-05-25 NOTE — Addendum Note (Signed)
Addended by: Gwenevere Abbot on: 05/25/2023 05:19 PM   Modules accepted: Orders

## 2023-05-26 ENCOUNTER — Other Ambulatory Visit (HOSPITAL_BASED_OUTPATIENT_CLINIC_OR_DEPARTMENT_OTHER): Payer: Self-pay

## 2023-05-26 ENCOUNTER — Other Ambulatory Visit (HOSPITAL_COMMUNITY): Payer: Self-pay

## 2023-05-26 ENCOUNTER — Other Ambulatory Visit: Payer: Self-pay

## 2023-05-27 ENCOUNTER — Telehealth: Payer: Self-pay

## 2023-05-27 ENCOUNTER — Other Ambulatory Visit (HOSPITAL_BASED_OUTPATIENT_CLINIC_OR_DEPARTMENT_OTHER): Payer: Self-pay

## 2023-05-27 NOTE — Progress Notes (Signed)
Christopher Clarke, pls contact patient and let him know to follow up with Dr. Barron Alvine as planned and Dr. Barron Alvine will order a colonoscopy at that time, he will discuss further with patient at time of follow up appt. Please schedule patient for a follow up appt in 4 to 6 months if not already scheduled. Patient to contact office if he develops colitis symptoms. THX.

## 2023-05-27 NOTE — Telephone Encounter (Signed)
Author: Arnaldo Natal, NP Service: Gastroenterology Author Type: Nurse Practitioner  Filed: 05/27/2023  7:48 AM Encounter Date: 04/23/2023 Status: Signed  Editor: Arnaldo Natal, NP (Nurse Practitioner)   Viviann Spare, pls contact patient and let him know to follow up with Dr. Barron Alvine as planned and Dr. Barron Alvine will order a colonoscopy at that time, he will discuss further with patient at time of follow up appt. Please schedule patient for a follow up appt in 4 to 6 months if not already scheduled. Patient to contact office if he develops colitis symptoms. THX.

## 2023-05-27 NOTE — Telephone Encounter (Signed)
Left message for patient to call back  

## 2023-05-28 NOTE — Telephone Encounter (Signed)
Left message for patient to call back  

## 2023-06-03 ENCOUNTER — Ambulatory Visit: Payer: Commercial Managed Care - PPO | Admitting: Gastroenterology

## 2023-06-03 NOTE — Telephone Encounter (Signed)
Patient is active on mychart. Sent mychart message & asked that he give [place] a call to schedule his appointment. Recall placed as well.

## 2023-06-04 ENCOUNTER — Ambulatory Visit: Payer: Commercial Managed Care - PPO | Admitting: Medical

## 2023-06-04 ENCOUNTER — Other Ambulatory Visit (HOSPITAL_COMMUNITY): Payer: Self-pay

## 2023-06-04 VITALS — BP 118/72 | HR 80 | Resp 18 | Ht 70.0 in

## 2023-06-04 DIAGNOSIS — Z8701 Personal history of pneumonia (recurrent): Secondary | ICD-10-CM | POA: Diagnosis not present

## 2023-06-04 DIAGNOSIS — Z8616 Personal history of COVID-19: Secondary | ICD-10-CM

## 2023-06-04 DIAGNOSIS — R059 Cough, unspecified: Secondary | ICD-10-CM | POA: Diagnosis not present

## 2023-06-04 MED ORDER — BENZONATATE 100 MG PO CAPS: 100.0000 mg | ORAL_CAPSULE | Freq: Three times a day (TID) | ORAL | 0 refills | Status: DC | PRN

## 2023-06-04 NOTE — Progress Notes (Signed)
Subjective:    Patient ID: Christopher Clarke, male    DOB: 06-02-2001, 22 y.o.   MRN: [id number]  HPI  Discussed the use of AI scribe software for clinical note transcription with the patient, who gave verbal consent to proceed.  History of Present Illness   The patient, previously diagnosed with pneumonia and COVID-19, reports a persistent dry cough that occurs throughout the day and night. He describes the sensation as similar to his previous experience with pneumonia, feeling as though something is "still hanging around in my lungs." Despite this, the patient notes an overall improvement in his health since his last visit, with increased energy and no symptoms of fever, chills, or sweats.  The patient has been adhering to his prescribed treatment plan, which includes azithromycin, Augmentin, and Paxlovid. He also has been using cough tablets and an albuterol inhaler as needed. (never had to use the albuterol) The patient denies experiencing any wheezing.  The patient's work schedule was affected by his illness, requiring him to take several days off. However, he has since returned to work. The patient's recent chest x-ray confirmed pneumonia, with infection noted on the right side of the lung and possible infection or atelectasis on the left side. His white blood cell count was slightly elevated at 11.2, indicating a mild infection.      History of Present Illness           Review of Systems  Constitutional:  Negative for chills, fatigue and fever.  HENT:  Negative for congestion and ear pain.   Respiratory:  Positive for cough. Negative for chest tightness, shortness of breath and wheezing.   Cardiovascular:  Negative for chest pain and palpitations.  Gastrointestinal:  Negative for abdominal pain.  Genitourinary:  Negative for dysuria.  Musculoskeletal:  Negative for back pain.  Neurological:  Negative for facial asymmetry and light-headedness.  Hematological:  Negative for adenopathy.  Does not bruise/bleed easily.    Past Medical History:  Diagnosis Date   Anxiety    Asthma    Depression    Environmental allergies    IBS (irritable bowel syndrome)    OCD (obsessive compulsive disorder)    Scoliosis    Ulcerative colitis (HCC)      Social History   Socioeconomic History   Marital status: Single    Spouse name: Not on file   Number of children: 0   Years of education: Not on file   Highest education level: Associate degree: academic program  Occupational History   Occupation: eletriction  Tobacco Use   Smoking status: Never   Smokeless tobacco: Never  Vaping Use   Vaping status: Never Used  Substance and Sexual Activity   Alcohol use: Never   Drug use: Never   Sexual activity: Not on file  Other Topics Concern   Not on file  Social History Narrative   Not on file   Social Determinants of Health   Financial Resource Strain: High Risk (05/22/2023)   Overall Financial Resource Strain (CARDIA)    Difficulty of Paying Living Expenses: Hard  Food Insecurity: No Food Insecurity (05/22/2023)   Hunger Vital Sign    Worried About Running Out of Food in the Last Year: Never true    Ran Out of Food in the Last Year: Never true  Transportation Needs: No Transportation Needs (05/22/2023)   PRAPARE - Administrator, Civil Service (Medical): No    Lack of Transportation (Non-Medical): No  Physical Activity: Sufficiently  Active (05/22/2023)   Exercise Vital Sign    Days of Exercise per Week: 6 days    Minutes of Exercise per Session: 150+ min  Stress: Stress Concern Present (05/22/2023)   [employer] of Occupational Health - Occupational Stress Questionnaire    Feeling of Stress : To some extent  Social Connections: Moderately Integrated (05/22/2023)   Social Connection and Isolation Panel [NHANES]    Frequency of Communication with Friends and Family: More than three times a week    Frequency of Social Gatherings with Friends and Family: More  than three times a week    Attends Religious Services: More than 4 times per year    Active Member of Clubs or Organizations: Yes    Attends Banker Meetings: More than 4 times per year    Marital Status: Never married  Intimate Partner Violence: Not on file    Past Surgical History:  Procedure Laterality Date   COLONOSCOPY WITH PROPOFOL N/A 01/20/2022   Procedure: COLONOSCOPY WITH PROPOFOL;  Surgeon: Wyline Mood, MD;  Location: [facility] ENDOSCOPY;  Service: Gastroenterology;  Laterality: N/A;   COLONOSCOPY, ESOPHAGOGASTRODUODENOSCOPY (EGD) AND ESOPHAGEAL DILATION      Family History  Problem Relation Age of Onset   Irritable bowel syndrome Mother    Irritable bowel syndrome Father    Esophagitis Paternal Grandmother    Liver disease Neg Hx    Esophageal cancer Neg Hx    Colon cancer Neg Hx     Allergies  Allergen Reactions   Gluten Meal     Other reaction(s): GI Upset (intolerance)   Milk (Cow)     Other reaction(s): Other (See Comments) Swelling (EOE)   Milk-Related Compounds    Other     Other reaction(s): Other (See Comments) Pt stated that he allergic to all preservatives   Shellfish-Derived Products     Other reaction(s): GI Upset (intolerance)   Soy Allergy     Other reaction(s): GI Upset (intolerance)    Current Outpatient Medications on File Prior to Visit  Medication Sig Dispense Refill   albuterol (VENTOLIN HFA) 108 (90 Base) MCG/ACT inhaler Inhale 2 puffs into the lungs every 6 (six) hours as needed for wheezing or shortness of breath. 6.7 g 5   amoxicillin-clavulanate (AUGMENTIN) 875-125 MG tablet Take 1 tablet by mouth 2 (two) times daily. 20 tablet 0   benzonatate (TESSALON) 100 MG capsule Take 1 capsule (100 mg total) by mouth 3 (three) times daily as needed. 30 capsule 0   folic acid (FOLVITE) 1 MG tablet Take 1 tablet by mouth daily. (Patient not taking: Reported on 04/23/2023)     HYDROcodone bit-homatropine (HYCODAN) 5-1.5 MG/5ML syrup Take 5  mLs by mouth every 6 (six) hours as needed for cough. (Patient not taking: Reported on 04/23/2023) 120 mL 0   ipratropium (ATROVENT) 0.03 % nasal spray Place 2 sprays into both nostrils every 12 (twelve) hours. 30 mL 12   mesalamine (CANASA) 1000 MG suppository Unwrap and insert 1 suppository (1,000 mg total) rectally at bedtime . 90 suppository 1   mesalamine (CANASA) 1000 MG suppository Place 1 suppository (1,000 mg total) rectally at bedtime. 90 suppository 1   mometasone (NASONEX) 50 MCG/ACT nasal spray Place 2 sprays into the nose daily. (Patient not taking: Reported on 04/23/2023)     Omega 3 1000 MG CAPS Take 2 g by mouth daily. (Patient not taking: Reported on 04/23/2023)     No current facility-administered medications on file prior to visit.  BP 118/72   Pulse 80   Resp 18   Ht 5\' 10"  (1.778 m)   SpO2 96%   BMI 19.66 kg/m        Objective:   Physical Exam  General Mental Status- Alert. General Appearance- Not in acute distress.   Skin General: Color- Normal Color. Moisture- Normal Moisture.  Neck No JVD.  Chest and Lung Exam Auscultation: Breath Sounds: clear, even and unlabored.  Cardiovascular Auscultation:Rythm- Regular. Murmurs & Other Heart Sounds:Auscultation of the heart reveals- No Murmurs.   Neurologic Cranial Nerve exam:- CN III-XII intact(No nystagmus), symmetric smile. Strength:- 5/5 equal and symmetric strength both upper and lower extremities.   Heent- no sinus pressure    Assessment & Plan:   Assessment and Plan    Pneumonia Persistent dry cough following recent pneumonia and COVID-19 infection. Lungs clear on auscultation. Completed azithromycin and Augmentin course. -Continue benzonatate for cough. -Repeat chest x-ray and blood work at approximate to  2 weeks mark from onset treatment to assess resolution of pneumonia.  COVID-19 Recent infection treated with Paxlovid. No current symptoms. -Monitor for any rebound or worsening  symptoms and notify the office if any occur.  General Health Maintenance -Use albuterol inhaler as needed for wheezing. -Schedule blood work for next week (Monday, Tuesday, or Wednesday at 4pm).   Follow up appointment in office to be detemined after lab and imaging review.        Esperanza Richters, PA-C

## 2023-06-04 NOTE — Patient Instructions (Signed)
Pneumonia Persistent dry cough following recent pneumonia and COVID-19 infection. Lungs clear on auscultation. Completed azithromycin and Augmentin course. -Continue benzonatate for cough. -Repeat chest x-ray and blood work at approximate to  2 weeks mark from onset treatment to assess resolution of pneumonia.  COVID-19 Recent infection treated with Paxlovid. No current symptoms. -Monitor for any rebound or worsening symptoms and notify the office if any occur.  General Health Maintenance -Use albuterol inhaler as needed for wheezing. -Schedule blood work for next week (Monday, Tuesday, or Wednesday at 4pm).   Follow up appointment in office to be detemined after lab and imaging review.

## 2023-06-05 ENCOUNTER — Other Ambulatory Visit (HOSPITAL_COMMUNITY): Payer: Self-pay

## 2023-06-11 ENCOUNTER — Ambulatory Visit (HOSPITAL_BASED_OUTPATIENT_CLINIC_OR_DEPARTMENT_OTHER)
Admission: RE | Admit: 2023-06-11 | Discharge: 2023-06-11 | Disposition: A | Discharge status: Home / Self Care | Payer: Commercial Managed Care - PPO | Source: Ambulatory Visit | Attending: Medical | Admitting: Medical

## 2023-06-11 DIAGNOSIS — R059 Cough, unspecified: Secondary | ICD-10-CM | POA: Insufficient documentation

## 2023-06-11 DIAGNOSIS — Z8701 Personal history of pneumonia (recurrent): Secondary | ICD-10-CM | POA: Insufficient documentation

## 2023-06-11 DIAGNOSIS — R918 Other nonspecific abnormal finding of lung field: Secondary | ICD-10-CM | POA: Diagnosis not present

## 2023-09-30 ENCOUNTER — Other Ambulatory Visit: Payer: Self-pay

## 2023-10-07 ENCOUNTER — Encounter: Payer: Self-pay | Admitting: Gastroenterology

## 2023-10-15 ENCOUNTER — Telehealth: Payer: Commercial Managed Care - PPO | Admitting: Physician Assistant

## 2023-10-15 DIAGNOSIS — B9689 Other specified bacterial agents as the cause of diseases classified elsewhere: Secondary | ICD-10-CM | POA: Diagnosis not present

## 2023-10-15 DIAGNOSIS — J208 Acute bronchitis due to other specified organisms: Secondary | ICD-10-CM

## 2023-10-15 DIAGNOSIS — U071 COVID-19: Secondary | ICD-10-CM

## 2023-10-15 DIAGNOSIS — B999 Unspecified infectious disease: Secondary | ICD-10-CM | POA: Diagnosis not present

## 2023-10-15 DIAGNOSIS — J4541 Moderate persistent asthma with (acute) exacerbation: Secondary | ICD-10-CM | POA: Diagnosis not present

## 2023-10-15 MED ORDER — AZITHROMYCIN 250 MG PO TABS
ORAL_TABLET | ORAL | 0 refills | Status: AC
Start: 2023-10-15 — End: 2023-10-20

## 2023-10-15 MED ORDER — PREDNISONE 20 MG PO TABS
40.0000 mg | ORAL_TABLET | Freq: Every day | ORAL | 0 refills | Status: DC
Start: 2023-10-15 — End: 2024-04-08

## 2023-10-15 NOTE — Progress Notes (Signed)
E-Visit for Cough   We are sorry that you are not feeling well.  Here is how we plan to help!  Based on your presentation I believe you most likely have  A cough due to bacteria that you caught secondary to Covid 19.  When patients have a fever and a productive cough with a change in color or increased sputum production, we are concerned about bacterial bronchitis.  If left untreated it can progress to pneumonia.  If your symptoms do not improve with your treatment plan it is important that you contact your provider.   I have prescribed Azithromyin 250 mg: two tablets now and then one tablet daily for 4 additonal days   In addition you may use A non-prescription cough medication called Mucinex DM: take 2 tablets every 12 hours.  Prednisone 20mg  Take 40mg  (2 tablets) daily for 5-7 days  From your responses in the eVisit questionnaire you describe inflammation in the upper respiratory tract which is causing a significant cough.  This is commonly called Bronchitis and has four common causes:   Allergies Viral Infections Acid Reflux Bacterial Infection Allergies, viruses and acid reflux are treated by controlling symptoms or eliminating the cause. An example might be a cough caused by taking certain blood pressure medications. You stop the cough by changing the medication. Another example might be a cough caused by acid reflux. Controlling the reflux helps control the cough.  USE OF BRONCHODILATOR ("RESCUE") INHALERS: There is a risk from using your bronchodilator too frequently.  The risk is that over-reliance on a medication which only relaxes the muscles surrounding the breathing tubes can reduce the effectiveness of medications prescribed to reduce swelling and congestion of the tubes themselves.  Although you feel brief relief from the bronchodilator inhaler, your asthma may actually be worsening with the tubes becoming more swollen and filled with mucus.  This can delay other crucial treatments,  such as oral steroid medications. If you need to use a bronchodilator inhaler daily, several times per day, you should discuss this with your provider.  There are probably better treatments that could be used to keep your asthma under control.     HOME CARE Only take medications as instructed by your medical team. Complete the entire course of an antibiotic. Drink plenty of fluids and get plenty of rest. Avoid close contacts especially the very young and the elderly Cover your mouth if you cough or cough into your sleeve. Always remember to wash your hands A steam or ultrasonic humidifier can help congestion.   GET HELP RIGHT AWAY IF: You develop worsening fever. You become short of breath You cough up blood. Your symptoms persist after you have completed your treatment plan MAKE SURE YOU  Understand these instructions. Will watch your condition. Will get help right away if you are not doing well or get worse.    Thank you for choosing an e-visit.  Your e-visit answers were reviewed by a board certified advanced clinical practitioner to complete your personal care plan. Depending upon the condition, your plan could have included both over the counter or prescription medications.  Please review your pharmacy choice. Make sure the pharmacy is open so you can pick up prescription now. If there is a problem, you may contact your provider through [employer] and have the prescription routed to another pharmacy.  Your safety is important to [place]. If you have drug allergies check your prescription carefully.   For the next 24 hours you can use  MyChart to ask questions about today's visit, request a non-urgent call back, or ask for a work or school excuse. You will get an email in the next two days asking about your experience. I hope that your e-visit has been valuable and will speed your recovery.   I have spent 5 minutes in review of e-visit questionnaire, review and updating patient  chart, medical decision making and response to patient.   Margaretann Loveless, PA-C

## 2023-10-22 ENCOUNTER — Ambulatory Visit: Payer: Commercial Managed Care - PPO | Admitting: Medical

## 2023-10-22 VITALS — BP 106/64 | HR 80 | Temp 98.1°F | Resp 18 | Wt 140.0 lb

## 2023-10-22 DIAGNOSIS — R059 Cough, unspecified: Secondary | ICD-10-CM | POA: Diagnosis not present

## 2023-10-22 DIAGNOSIS — J0100 Acute maxillary sinusitis, unspecified: Secondary | ICD-10-CM

## 2023-10-22 MED ORDER — AMOXICILLIN-POT CLAVULANATE 875-125 MG PO TABS: 1.0000 | ORAL_TABLET | Freq: Two times a day (BID) | ORAL | 0 refills | Status: DC

## 2023-10-22 MED ORDER — BENZONATATE 100 MG PO CAPS: 100.0000 mg | ORAL_CAPSULE | Freq: Three times a day (TID) | ORAL | 0 refills | Status: DC | PRN

## 2023-10-22 MED ORDER — FLUTICASONE PROPIONATE 50 MCG/ACT NA SUSP: 2.0000 | Freq: Every day | NASAL | 1 refills | Status: DC

## 2023-10-22 NOTE — Progress Notes (Signed)
Subjective:    Patient ID: Christopher Clarke, male    DOB: 01-10-2001, 22 y.o.   MRN: [id number]  HPI Discussed the use of AI scribe software for clinical note transcription with the patient, who gave verbal consent to proceed.  History of Present Illness   The patient notes history of recurrent pneumonia twice in past year but last pneumonia was sept 2024, He has recently been ill for eight days. The illness began with fever, chills, body aches, and nausea, which gradually improved over three days. The patient did not initially have a cough, which only started on the fifth day of illness.  The patient has been experiencing nasal congestion for over a week, which coincided with the onset of the cough. The cough is productive, with the patient bringing up a significant amount of mucus. The patient also reports chest congestion and sinus discomfort, particularly on one side.  Upon blowing the nose, the patient notes the production of a large amount of yellowish-brown mucus, requiring multiple tissues. The patient has had two episodes of pneumonia in the past year but denies any current lung symptoms. The patient has previously tolerated penicillin-based antibiotics.        Past Medical History:  Diagnosis Date   Anxiety    Asthma    Depression    Environmental allergies    IBS (irritable bowel syndrome)    OCD (obsessive compulsive disorder)    Scoliosis    Ulcerative colitis (HCC)      Social History   Socioeconomic History   Marital status: Single    Spouse name: Not on file   Number of children: 0   Years of education: Not on file   Highest education level: Associate degree: academic program  Occupational History   Occupation: eletriction  Tobacco Use   Smoking status: Never   Smokeless tobacco: Never  Vaping Use   Vaping status: Never Used  Substance and Sexual Activity   Alcohol use: Never   Drug use: Never   Sexual activity: Not on file  Other Topics Concern   Not on  file  Social History Narrative   Not on file   Social Drivers of Health   Financial Resource Strain: High Risk (05/22/2023)   Overall Financial Resource Strain (CARDIA)    Difficulty of Paying Living Expenses: Hard  Food Insecurity: No Food Insecurity (05/22/2023)   Hunger Vital Sign    Worried About Running Out of Food in the Last Year: Never true    Ran Out of Food in the Last Year: Never true  Transportation Needs: No Transportation Needs (05/22/2023)   PRAPARE - Administrator, Civil Service (Medical): No    Lack of Transportation (Non-Medical): No  Physical Activity: Sufficiently Active (05/22/2023)   Exercise Vital Sign    Days of Exercise per Week: 6 days    Minutes of Exercise per Session: 150+ min  Stress: Stress Concern Present (05/22/2023)   [employer] of Occupational Health - Occupational Stress Questionnaire    Feeling of Stress : To some extent  Social Connections: Moderately Integrated (05/22/2023)   Social Connection and Isolation Panel [NHANES]    Frequency of Communication with Friends and Family: More than three times a week    Frequency of Social Gatherings with Friends and Family: More than three times a week    Attends Religious Services: More than 4 times per year    Active Member of Clubs or Organizations: Yes    Attends  Club or Organization Meetings: More than 4 times per year    Marital Status: Never married  Intimate Partner Violence: Not on file    Past Surgical History:  Procedure Laterality Date   COLONOSCOPY WITH PROPOFOL N/A 01/20/2022   Procedure: COLONOSCOPY WITH PROPOFOL;  Surgeon: Wyline Mood, MD;  Location: [facility] ENDOSCOPY;  Service: Gastroenterology;  Laterality: N/A;   COLONOSCOPY, ESOPHAGOGASTRODUODENOSCOPY (EGD) AND ESOPHAGEAL DILATION      Family History  Problem Relation Age of Onset   Irritable bowel syndrome Mother    Irritable bowel syndrome Father    Esophagitis Paternal Grandmother    Liver disease Neg Hx     Esophageal cancer Neg Hx    Colon cancer Neg Hx     Allergies  Allergen Reactions   Gluten Meal     Other reaction(s): GI Upset (intolerance)   Milk (Cow)     Other reaction(s): Other (See Comments) Swelling (EOE)   Milk-Related Compounds    Other     Other reaction(s): Other (See Comments) Pt stated that he allergic to all preservatives   Shellfish-Derived Products     Other reaction(s): GI Upset (intolerance)   Soy Allergy (Do Not Select)     Other reaction(s): GI Upset (intolerance)    Current Outpatient Medications on File Prior to Visit  Medication Sig Dispense Refill   albuterol (VENTOLIN HFA) 108 (90 Base) MCG/ACT inhaler Inhale 2 puffs into the lungs every 6 (six) hours as needed for wheezing or shortness of breath. 6.7 g 5   folic acid (FOLVITE) 1 MG tablet Take 1 tablet by mouth daily. (Patient not taking: Reported on 04/23/2023)     ipratropium (ATROVENT) 0.03 % nasal spray Place 2 sprays into both nostrils every 12 (twelve) hours. 30 mL 12   mesalamine (CANASA) 1000 MG suppository Unwrap and insert 1 suppository (1,000 mg total) rectally at bedtime . 90 suppository 1   mesalamine (CANASA) 1000 MG suppository Place 1 suppository (1,000 mg total) rectally at bedtime. 90 suppository 1   Omega 3 1000 MG CAPS Take 2 g by mouth daily. (Patient not taking: Reported on 04/23/2023)     predniSONE (DELTASONE) 20 MG tablet Take 2 tablets (40 mg total) by mouth daily with breakfast. 14 tablet 0   No current facility-administered medications on file prior to visit.    BP 106/64   Pulse 80   Temp 98.1 F (36.7 C)   Resp 18   Wt 140 lb (63.5 kg)   SpO2 96%   BMI 20.09 kg/m     Review of Systems See hpi    Objective:   Physical Exam  General Mental Status- Alert. General Appearance- Not in acute distress.   Skin General: Color- Normal Color. Moisture- Normal Moisture.  Neck Carotid Arteries- Normal color. Moisture- Normal Moisture. No carotid bruits. No  JVD.  Chest and Lung Exam Auscultation: Breath Sounds:-Normal.  Cardiovascular Auscultation:Rythm- Regular. Murmurs & Other Heart Sounds:Auscultation of the heart reveals- No Murmurs.  Abdomen Inspection:-Inspeection Normal. Palpation/Percussion:Note:No mass. Palpation and Percussion of the abdomen reveal- Non Tender, Non Distended + BS, no rebound or guarding.    Neurologic Cranial Nerve exam:- CN III-XII intact(No nystagmus), symmetric smile. Strength:- 5/5 equal and symmetric strength both upper and lower extremities.   Heent- faint maxillary sinus pressure. Rt tm mild dull red.Normal posterior pharynx.    Assessment & Plan:  Sinusitis Persistent nasal congestion, sinus pain, and yellowish-brown mucus for 8 days. History of pneumonia twice in the past 12 months. Lungs  clear on examination. -Prescribe Augmentin for 10 days. -Prescribe Flonase nasal spray for congestion. -If symptoms worsen or do not improve in 7-10 days, patient to contact office.  Cough New onset productive cough with chest congestion. -Prescribe benzonatate for cough.  Viral Syndrome Initial presentation with fever, chills, and body aches suggestive of viral syndrome. No COVID test performed. No longer having viral syndrome signs/symptoms.  Follow up 10 days or sooner if needed

## 2023-10-22 NOTE — Patient Instructions (Signed)
Sinusitis Persistent nasal congestion, sinus pain, and yellowish-brown mucus for 8 days. History of pneumonia twice in the past 12 months. Lungs clear on examination. -Prescribe Augmentin for 10 days. -Prescribe Flonase nasal spray for congestion. -If symptoms worsen or do not improve in 7-10 days, patient to contact office.  Cough New onset productive cough with chest congestion. -Prescribe benzonatate for cough.  Viral Syndrome Initial presentation with fever, chills, and body aches suggestive of viral syndrome. No COVID test performed. No longer having viral syndrome signs/symptoms.  Follow up 10 days or sooner if needed

## 2023-11-07 ENCOUNTER — Telehealth: Payer: Commercial Managed Care - PPO | Admitting: Family

## 2023-11-07 DIAGNOSIS — J029 Acute pharyngitis, unspecified: Secondary | ICD-10-CM

## 2023-11-07 DIAGNOSIS — Z20818 Contact with and (suspected) exposure to other bacterial communicable diseases: Secondary | ICD-10-CM | POA: Diagnosis not present

## 2023-11-07 MED ORDER — AMOXICILLIN 500 MG PO CAPS
500.0000 mg | ORAL_CAPSULE | Freq: Two times a day (BID) | ORAL | 0 refills | Status: AC
Start: 2023-11-07 — End: 2023-11-17

## 2023-11-07 NOTE — Progress Notes (Signed)
E-Visit for Sore Throat - Strep Symptoms ? ?We are sorry that you are not feeling well.  Here is how we plan to help! ? ?Based on what you have shared with me it is likely that you have strep pharyngitis.  Strep pharyngitis is inflammation and infection in the back of the throat.  This is an infection cause by bacteria and is treated with antibiotics.  I have prescribed Amoxicillin 500 mg twice a day for 10 days. For throat pain, we recommend over the counter oral pain relief medications such as acetaminophen or aspirin, or anti-inflammatory medications such as ibuprofen or naproxen sodium. Topical treatments such as oral throat lozenges or sprays may be used as needed. Strep infections are not as easily transmitted as other respiratory infections, however we still recommend that you avoid close contact with loved ones, especially the very young and elderly.  Remember to wash your hands thoroughly throughout the day as this is the number one way to prevent the spread of infection and wipe down door knobs and counters with disinfectant. ? ? ?Home Care: ?Only take medications as instructed by your medical team. ?Complete the entire course of an antibiotic. ?Do not take these medications with alcohol. ?A steam or ultrasonic humidifier can help congestion.  You can place a towel over your head and breathe in the steam from hot water coming from a faucet. ?Avoid close contacts especially the very young and the elderly. ?Cover your mouth when you cough or sneeze. ?Always remember to wash your hands. ? ?Get Help Right Away If: ?You develop worsening fever or sinus pain. ?You develop a severe head ache or visual changes. ?Your symptoms persist after you have completed your treatment plan. ? ?Make sure you ?Understand these instructions. ?Will watch your condition. ?Will get help right away if you are not doing well or get worse. ? ? ?Thank you for choosing an e-visit. ? ?Your e-visit answers were reviewed by a board  certified advanced clinical practitioner to complete your personal care plan. Depending upon the condition, your plan could have included both over the counter or prescription medications. ? ?Please review your pharmacy choice. Make sure the pharmacy is open so you can pick up prescription now. If there is a problem, you may contact your provider through MyChart messaging and have the prescription routed to another pharmacy.  Your safety is important to us. If you have drug allergies check your prescription carefully.  ? ?For the next 24 hours you can use MyChart to ask questions about today's visit, request a non-urgent call back, or ask for a work or school excuse. ?You will get an email in the next two days asking about your experience. I hope that your e-visit has been valuable and will speed your recovery. ? ?Approximately 5 minutes was spent documenting and reviewing patient's chart.  ? ? ?

## 2023-12-03 ENCOUNTER — Encounter: Payer: Commercial Managed Care - PPO | Admitting: Medical

## 2023-12-19 ENCOUNTER — Telehealth

## 2023-12-19 DIAGNOSIS — Z20818 Contact with and (suspected) exposure to other bacterial communicable diseases: Secondary | ICD-10-CM | POA: Diagnosis not present

## 2023-12-19 DIAGNOSIS — J020 Streptococcal pharyngitis: Secondary | ICD-10-CM

## 2023-12-20 MED ORDER — AMOXICILLIN-POT CLAVULANATE 875-125 MG PO TABS
1.0000 | ORAL_TABLET | Freq: Two times a day (BID) | ORAL | 0 refills | Status: DC
Start: 2023-12-20 — End: 2024-04-08

## 2023-12-20 NOTE — Progress Notes (Signed)
 E-Visit for Sore Throat - Strep Symptoms  We are sorry that you are not feeling well.  Here is how we plan to help!  Based on what you have shared with me it is likely that you have strep pharyngitis.  Strep pharyngitis is inflammation and infection in the back of the throat.  This is an infection cause by bacteria and is treated with antibiotics.  I have prescribed Augmentin 875-125mg  Take 1 tablet twice dailt for 10 days. For throat pain, we recommend over the counter oral pain relief medications such as acetaminophen or aspirin, or anti-inflammatory medications such as ibuprofen or naproxen sodium. Topical treatments such as oral throat lozenges or sprays may be used as needed. Strep infections are not as easily transmitted as other respiratory infections, however we still recommend that you avoid close contact with loved ones, especially the very young and elderly.  Remember to wash your hands thoroughly throughout the day as this is the number one way to prevent the spread of infection and wipe down door knobs and counters with disinfectant.   Home Care: Only take medications as instructed by your medical team. Complete the entire course of an antibiotic. Do not take these medications with alcohol. A steam or ultrasonic humidifier can help congestion.  You can place a towel over your head and breathe in the steam from hot water coming from a faucet. Avoid close contacts especially the very young and the elderly. Cover your mouth when you cough or sneeze. Always remember to wash your hands.  Get Help Right Away If: You develop worsening fever or sinus pain. You develop a severe head ache or visual changes. Your symptoms persist after you have completed your treatment plan.  Make sure you Understand these instructions. Will watch your condition. Will get help right away if you are not doing well or get worse.   Thank you for choosing an e-visit.  Your e-visit answers were reviewed by  a board certified advanced clinical practitioner to complete your personal care plan. Depending upon the condition, your plan could have included both over the counter or prescription medications.  Please review your pharmacy choice. Make sure the pharmacy is open so you can pick up prescription now. If there is a problem, you may contact your provider through [employer] and have the prescription routed to another pharmacy.  Your safety is important to [place]. If you have drug allergies check your prescription carefully.   For the next 24 hours you can use MyChart to ask questions about today's visit, request a non-urgent call back, or ask for a work or school excuse. You will get an email in the next two days asking about your experience. I hope that your e-visit has been valuable and will speed your recovery.   I have spent 5 minutes in review of e-visit questionnaire, review and updating patient chart, medical decision making and response to patient.   Margaretann Loveless, PA-C

## 2023-12-31 ENCOUNTER — Other Ambulatory Visit: Payer: Self-pay | Admitting: Gastroenterology

## 2024-01-09 ENCOUNTER — Other Ambulatory Visit: Payer: Self-pay | Admitting: Nurse Practitioner

## 2024-01-09 MED ORDER — MESALAMINE 1000 MG RE SUPP
1000.0000 mg | Freq: Every day | RECTAL | 1 refills | Status: DC
  Filled 2024-01-09: qty 90, 90d supply, fill #0
  Filled 2024-05-18: qty 90, 90d supply, fill #1

## 2024-01-10 ENCOUNTER — Other Ambulatory Visit (HOSPITAL_COMMUNITY): Payer: Self-pay

## 2024-02-07 ENCOUNTER — Other Ambulatory Visit (HOSPITAL_COMMUNITY): Payer: Self-pay

## 2024-04-08 ENCOUNTER — Other Ambulatory Visit: Payer: Self-pay

## 2024-04-08 ENCOUNTER — Ambulatory Visit
Admission: RE | Admit: 2024-04-08 | Discharge: 2024-04-08 | Disposition: A | Discharge status: Home / Self Care | Source: Ambulatory Visit | Attending: Family Medicine | Admitting: Family Medicine

## 2024-04-08 VITALS — BP 109/65 | HR 67 | Temp 98.4°F | Resp 17

## 2024-04-08 DIAGNOSIS — Z23 Encounter for immunization: Secondary | ICD-10-CM

## 2024-04-08 DIAGNOSIS — S91332A Puncture wound without foreign body, left foot, initial encounter: Secondary | ICD-10-CM

## 2024-04-08 MED ORDER — AMOXICILLIN-POT CLAVULANATE 875-125 MG PO TABS: 1.0000 | ORAL_TABLET | Freq: Two times a day (BID) | ORAL | 0 refills | Status: DC

## 2024-04-08 MED ORDER — TETANUS-DIPHTH-ACELL PERTUSSIS 5-2.5-18.5 LF-MCG/0.5 IM SUSY
0.5000 mL | PREFILLED_SYRINGE | Freq: Once | INTRAMUSCULAR | Status: AC
  Administered 2024-04-08: 0.5 mL via INTRAMUSCULAR

## 2024-04-08 NOTE — ED Provider Notes (Signed)
 Christopher Clarke    CSN: [id number] Arrival date & time: 04/08/24  1423      History   Chief Complaint Chief Complaint  Patient presents with   Foot Injury    RT; stepped on nail    HPI Christopher Clarke is a 23 y.o. male.   HPI 23 year old male presents with puncture wound of right foot.  Patient reports stepped on a nail on a house he is fixing up.  Patient is accompanied by his wife this evening.  PMH significant for OCD, IBS, and UC.  Past Medical History:  Diagnosis Date   Anxiety    Asthma    Depression    Environmental allergies    IBS (irritable bowel syndrome)    OCD (obsessive compulsive disorder)    Scoliosis    Ulcerative colitis [facility])     Patient Active Problem List   Diagnosis Date Noted   Acne vulgaris 02/10/2021   Colitis 07/08/2016   Ulcerative colitis without complications (HCC) 09/12/2015   EE (eosinophilic esophagitis) 09/12/2015   Allergy 08/21/2015   Asthma 08/21/2015   Joint pain 08/21/2015   Chronic diarrhea 08/16/2015   Hematochezia 08/16/2015   Lower abdominal pain 08/16/2015   Microcytic anemia 08/16/2015    Past Surgical History:  Procedure Laterality Date   COLONOSCOPY WITH PROPOFOL  N/A 01/20/2022   Procedure: COLONOSCOPY WITH PROPOFOL ;  Surgeon: Therisa Bi, MD;  Location: [facility] ENDOSCOPY;  Service: Gastroenterology;  Laterality: N/A;   COLONOSCOPY, ESOPHAGOGASTRODUODENOSCOPY (EGD) AND ESOPHAGEAL DILATION         Home Medications    Prior to Admission medications   Medication Sig Start Date End Date Taking? Authorizing Provider  amoxicillin -clavulanate (AUGMENTIN ) 875-125 MG tablet Take 1 tablet by mouth every 12 (twelve) hours. 04/08/24  Yes Teddy Sharper, FNP  albuterol  (VENTOLIN  HFA) 108 (90 Base) MCG/ACT inhaler Inhale 2 puffs into the lungs every 6 (six) hours as needed for wheezing or shortness of breath. 05/25/23   Saguier, [place], PA-C  fluticasone  (FLONASE ) 50 MCG/ACT nasal spray Place 2 sprays into both nostrils  daily. 10/22/23   Saguier, [place], PA-C  folic acid (FOLVITE) 1 MG tablet Take 1 tablet by mouth daily. Patient not taking: Reported on 04/23/2023 12/02/20   [provider]  ipratropium (ATROVENT ) 0.03 % nasal spray Place 2 sprays into both nostrils every 12 (twelve) hours. 05/20/23   Kennyth Domino, FNP  mesalamine  (CANASA ) 1000 MG suppository Unwrap and insert 1 suppository (1,000 mg total) rectally at bedtime . 10/30/22   Therisa Bi, MD  mesalamine  (CANASA ) 1000 MG suppository Place 1 suppository (1,000 mg total) rectally at bedtime. 01/09/24   Kennedy-Smith, Colleen M, NP  Omega 3 1000 MG CAPS Take 2 g by mouth daily. Patient not taking: Reported on 04/23/2023    [provider]    Family History Family History  Problem Relation Age of Onset   Irritable bowel syndrome Mother    Irritable bowel syndrome Father    Esophagitis Paternal Grandmother    Liver disease Neg Hx    Esophageal cancer Neg Hx    Colon cancer Neg Hx     Social History Social History   Tobacco Use   Smoking status: Never   Smokeless tobacco: Never  Vaping Use   Vaping status: Never Used  Substance Use Topics   Alcohol use: Never   Drug use: Never     Allergies   Gluten meal, Milk (cow), Milk-related compounds, Other, Shellfish-derived products, and Soy allergy (obsolete)  Review of Systems Review of Systems   Physical Exam Triage Vital Signs ED Triage Vitals  Encounter Vitals Group     BP      Girls Systolic BP Percentile      Girls Diastolic BP Percentile      Boys Systolic BP Percentile      Boys Diastolic BP Percentile      Pulse      Resp      Temp      Temp src      SpO2      Weight      Height      Head Circumference      Peak Flow      Pain Score      Pain Loc      Pain Education      Exclude from Growth Chart    No data found.  Updated Vital Signs BP 109/65 (BP Location: Right Arm)   Pulse 67   Temp 98.4 F (36.9 C) (Oral)   Resp 17   SpO2 96%    Visual Acuity Right Eye Distance:   Left Eye Distance:   Bilateral Distance:    Right Eye Near:   Left Eye Near:    Bilateral Near:     Physical Exam Vitals and nursing note reviewed.  Constitutional:      General: He is not in acute distress.    Appearance: Normal appearance. He is normal weight. He is not ill-appearing.  HENT:     Head: Normocephalic and atraumatic.     Mouth/Throat:     Mouth: Mucous membranes are moist.     Pharynx: Oropharynx is clear.  Eyes:     Extraocular Movements: Extraocular movements intact.     Conjunctiva/sclera: Conjunctivae normal.     Pupils: Pupils are equal, round, and reactive to light.  Cardiovascular:     Rate and Rhythm: Normal rate and regular rhythm.     Pulses: Normal pulses.     Heart sounds: Normal heart sounds.  Pulmonary:     Effort: Pulmonary effort is normal.     Breath sounds: Normal breath sounds. No wheezing, rhonchi or rales.  Musculoskeletal:        General: Normal range of motion.     Cervical back: Normal range of motion and neck supple.  Skin:    General: Skin is warm and dry.     Comments: Sole of left foot (beneath fifth metatarsal head): Tiny nonerythematous puncture wound-please see image below  Neurological:     General: No focal deficit present.     Mental Status: He is alert and oriented to person, place, and time. Mental status is at baseline.  Psychiatric:        Mood and Affect: Mood normal.        Behavior: Behavior normal.      UC Treatments / Results  Labs (all labs ordered are listed, but only abnormal results are displayed) Labs Reviewed - No data to display  EKG   Radiology No results found.  Procedures Procedures (including critical Clarke time)  Medications Ordered in UC Medications  Tdap (BOOSTRIX ) injection 0.5 mL (0.5 mLs Intramuscular Given 04/08/24 1442)    Initial Impression / Assessment and Plan / UC Course  I have reviewed the triage vital signs and the nursing  notes.  Pertinent labs & imaging results that were available during my Clarke of the patient were reviewed by me and considered in my medical decision  making (see chart for details).     MDM: 1.  Puncture wound of left foot, initial encounter-Tdap Boostrix  0.5 mL IM given once in clinic, Rx'd Augmentin  875/125 mg tablet: Take 1 tablet twice daily x 7 days. Patient to take medication as directed with food to completion.  Encouraged increase daily water intake to 64 ounces per day while taking his medication.  Advised if symptoms worsen and/or unresolved please follow-up with your PCP or here for further evaluation.  Patient discharged home, hemodynamically stable. Final Clinical Impressions(s) / UC Diagnoses   Final diagnoses:  Puncture wound of left foot, initial encounter     Discharge Instructions      Patient to take medication as directed with food to completion.  Encouraged increase daily water intake to 64 ounces per day while taking his medication.  Advised if symptoms worsen and/or unresolved please follow-up with your PCP or here for further evaluation.     ED Prescriptions     Medication Sig Dispense Auth. Provider   amoxicillin -clavulanate (AUGMENTIN ) 875-125 MG tablet Take 1 tablet by mouth every 12 (twelve) hours. 14 tablet Sallye Lunz, FNP      PDMP not reviewed this encounter.   Teddy Sharper, FNP 04/08/24 1451

## 2024-04-08 NOTE — Discharge Instructions (Addendum)
 Patient to take medication as directed with food to completion.  Encouraged increase daily water  intake to 64 ounces per day while taking his medication.  Advised if symptoms worsen and/or unresolved please follow-up with your PCP or here for further evaluation.

## 2024-04-08 NOTE — ED Triage Notes (Signed)
 Pt c/o RT foot injury since last night. Says he stepped on a nail in his house that he's fixing up. Tdap not up to date.

## 2024-04-30 ENCOUNTER — Telehealth: Admitting: Physician Assistant

## 2024-04-30 DIAGNOSIS — H6500 Acute serous otitis media, unspecified ear: Secondary | ICD-10-CM

## 2024-05-01 MED ORDER — AMOXICILLIN-POT CLAVULANATE 875-125 MG PO TABS
1.0000 | ORAL_TABLET | Freq: Two times a day (BID) | ORAL | 0 refills | Status: DC
Start: 2024-05-01 — End: 2024-06-23

## 2024-05-01 NOTE — Progress Notes (Signed)

## 2024-05-18 ENCOUNTER — Other Ambulatory Visit (HOSPITAL_COMMUNITY): Payer: Self-pay

## 2024-05-18 ENCOUNTER — Other Ambulatory Visit: Payer: Self-pay

## 2024-06-22 NOTE — Progress Notes (Unsigned)
 06/23/2024 Christopher Clarke Given [id number] 10/01/2000  Referring provider: Dorina Loving, PA-C Primary GI doctor: Dr. San  ASSESSMENT AND PLAN:  Ulcerative colitis Diagnosed age 23 previously on Imuran Lialda  transition to Canasa  suppositories in 2022 Colonoscopy 2023 rectal biopsies suggestive of quiescent colitis Can have urgency, diarrhea every 4-5 months, can have some joint pain associated with it, has lost about 10 lbs - Patient is with UC whose disease extends beyond the rectum to begin surveillance colonoscopy 8 years after diagnosis every 1 to 3 years, , after discussion with the patient will plan on colonoscopy for screening as well as disease reassessment with intermittent symptoms We have discussed the risks of bleeding, infection, perforation, medication reactions, and remote risk of death associated with colonoscopy. All questions were answered and the patient acknowledges these risk and wishes to proceed. -Check inflammatory labs, Fecal calprotectin, CBC, CMET, CRP, sed rate -Will check HBV surface ag,  and Quantiferon Gold  -check iron, ferritin - If this is unremarkable possible IBS overlap versus need to test for SIBO with weight loss  EOE with GERD EGD 2016 Negative food allergy panel Was on Prevacid No dysphagia, some GERD worsening last few months, patient avoids milk/gluten -Plan on EGD with colonoscopy to evaluate effectiveness of diet on EOE -consider PPI if elevated eosinophils  Neck pain/joint pain with bowel unrest Will check HLA B27 and xray May need to consider biologic pending work up Consider PT/follow up with PCP  Patient Care Team: Saguier, Loving, PA-C as PCP - General (Internal Medicine)  HISTORY OF PRESENT ILLNESS: 23 y.o. male with history of of anxiety, depression, scoliosis, asthma, eosinophilic esophagitis and ulcerative colitis initially diagnosed at the age of 58 per colonoscopy at [facility] and others below presents for  evaluation of UC and EOE. Last seen in the office on 04/23/2023 by Elida Berber Smith,NP.   IBD history: Diagnosed with pan ulcerative colitis age 68 after colonoscopy at [facility]. On Imuran and Lialda  until 2022 transition to only Canasa  suppositories nightly  Last colonoscopy: 01/20/2022 by Dr. Therisa, preparation was fair, 8 mm polyp ascending colon, localized moderate inflammation distal rectum otherwise unremarkable, biopsy show mild crypt shortening rectal biopsies suggestive of quiescent colitis medication patient was on Canasa  suppositories. Last small bowel imaging:  remote Extraintestinal manifestations: Arthralgias Surgical history: no surgery.  Other significant medical history: EOE with GERD-EGD 2016 Vitamin D  deficiency Neck pain at rest, no neuropathy  Current History Discussed the use of AI scribe software for clinical note transcription with the patient, who gave verbal consent to proceed.  History of Present Illness   Christopher Clarke is a 23 year old male with ulcerative colitis who presents with gastrointestinal symptoms and neck pain.  He has a history of ulcerative colitis since age 6, managed with nightly Canasa  suppositories. He experiences random urgency in his stomach approximately every four to five months, with a recent episode a couple of weeks ago, necessitating a stop at a gas station due to diarrhea. No nausea, vomiting, rectal pain, discharge, or blood in the stool during these urgencies. A colonoscopy in 2023 revealed an 8mm sessile serrated polyp and localized moderate inflammation distal rectum otherwise unremarkable, biopsy show mild crypt shortening rectal biopsies suggestive of quiescent colitis.  He experiences neck pain, which he describes as being around his neck and trapezius area. His spouse has noticed that the neck pain has been more frequent lately. The pain does not change with movement, worse with rest and is  not associated with numbness or  tingling in the hands. He also reports occasional joint pain, which he associates with intestinal upset, similar to symptoms experienced before his ulcerative colitis treatment began.  He has a history of eosinophilic esophagitis and was previously on Prevacid, which he has since discontinued. He manages his symptoms by avoiding certain foods, particularly milk and gluten, which he finds exacerbate his condition. He underwent food allergy testing, which did not reveal significant allergies, but he has identified specific foods that affect him.  He experienced significant weight loss after an illness that progressed to pneumonia, during which he lost about 10 pounds and has not regained the weight. He notes a decreased appetite since then. No recent fevers, chills, or changes in vision, and reports occasional heartburn but no trouble swallowing.      Recent labs: 04/23/2023 CRP <1.0  Fecal cal never 05/25/2023 WBC 11.2 HGB 13.2 MCV 88.5 Platelets 331 2021 iron 101, percent saturation 27 TIBC 375 ferritin 29 04/23/2023 AST 33 ALT 24 Alkphos 63 TBili 0.6 04/23/2023 VITAMIN D  39.32  TB GOLD 12/23/2021 Negative or TB skin if indeterminate.  HepBsAG 12/23/2021 Negative, history of hepatitis A immunity needs hepatitis B vaccinations TPMT Activity: No results found  IBD Health Care Maintenance: Discussed vaccinations and information given  Immunization History  Administered Date(s) Administered   DTaP 03/14/2003, 05/16/2003, 07/17/2003, 06/13/2004, 12/21/2006   H1N1 08/29/2008   HIB (PRP-T) 03/14/2003, 05/16/2003, 07/17/2003, 12/13/2003   HPV 9-valent 04/10/2020   HPV Quadrivalent 08/08/2015   Hepatitis A, Ped/Adol-2 Dose 12/18/2005, 12/21/2006   Hepatitis B, PED/ADOLESCENT 2003-05-16, 01/31/2003, 09/20/2003   INFLUENZA, HIGH DOSE SEASONAL PF 06/18/2008   IPV 03/14/2003, 05/16/2003, 07/17/2003, 12/21/2006   Influenza Inj Mdck Quad Pf 07/21/2017   Influenza Split 08/29/2007   MMR 12/13/2003,  12/21/2006   Meningococcal Conjugate 06/13/2014, 04/10/2020   Novel Infuenza-h1n1-09 08/29/2008   Pneumococcal Conjugate PCV 7 03/14/2003, 05/16/2003, 09/20/2003, 06/13/2004   Pneumococcal Conjugate-13 03/14/2003, 05/16/2003, 09/20/2003, 06/13/2004   Td 06/13/2014   Td (Adult),5 Lf Tetanus Toxid, Preservative Free 06/13/2014   Tdap 06/13/2014, 04/08/2024   Varicella 12/13/2003, 12/21/2006    RELEVANT GI HISTORY, LABS, IMAGING: Colonoscopy 01/20/2022 by Dr. Ruel Kung:  - Preparation of the colon was fair.  - One 8 mm polyp in the ascending colon, removed with a cold snare. Resected and retrieved.  - Localized moderate inflammation was found in the distal rectum secondary to colitis. Biopsied.  - The examination was otherwise normal on direct and retroflexion views. -Recall colonoscopy 7 years  A.  COLON, RIGHT, BIOPSIES: - MULTIPLE FRAGMENTS OF COLONIC MUCOSA WITH MILD NONSPECIFIC EDEMA. - FEATURES OF A SPECIFIC COLITIS ARE NOT IDENTIFIED. - NO SIGNIFICANT ATYPIA OR MALIGNANCY.  B.  COLON, ASCENDING, POLYP; COLD SNARE BIOPSIES: - SESSILE SERRATED POLYP.  C.  COLON, TRANSVERSE;, BIOPSIES: - MULTIPLE FRAGMENTS OF COLONIC MUCOSA WITH MILD NONSPECIFIC EDEMA. - FEATURES OF A SPECIFIC COLITIS ARE NOT IDENTIFIED. - NO SIGNIFICANT ATYPIA OR MALIGNANCY.  D.  COLON, LEFT; BIOPSIES: - COLONIC MUCOSA WITH NO SPECIFIC HISTOLOGIC ABNORMALITY. - -NO SIGNIFICANT ATYPIA OR MALIGNANCY.  E.  RECTUM; BIOPSIES: - RECTAL MUCOSA WITH MILD ARCHITECTURAL DISTORTION (SEE COMMENT). - NO EVIDENCE OF ACTIVE COLITIS. -NO SIGNIFICANT ATYPIA OR MALIGNANCY.  Comment: The rectal biopsy shows some crypt shortening and crypt dropout- the features are suggestive of quiescent colitis.   Colonoscopy 02/19/2020 [facility]: Showed ascending colon biopsies with no inflammation, transverse colon biopsies with no inflammation, descending colon biopsies no inflammation, sigmoid colon biopsies  with no  inflammation but rectal biopsy showed moderate chronic active colitis.   EGD 03/09/2017: Procedure report not available   Colonoscopy 03/09/2017: Procedure report not available   A.  DUODENUM, BIOPSY:       Duodenal mucosa with no diagnostic abnormality.       No villous blunting or increased intraepithelial  lymphocytes.   B. STOMACH, BIOPSY:       Mild chronic inactive gastritis.   C.  ESOPHAGUS, DISTAL, BIOPSY:       Squamous mucosa with up to 6 eosinophils per HPF.   D.  ESOPHAGUS, MID, BIOSPY:       Squamous mucosa with no diagnostic abnormality.   E.  TERMINAL ILLEUM, BIOSPY:       Ileal mucosa with no diagnostic abnormality.   F.  COLON, ASCENDING, BIOPSY:       Focal active colitis.       No dysplasia identified.   G.  COLON, TRANSVERSE, BIOPSY:       Colonic mucosa with no diagnostic abnormality.       No dysplasia identified.   H.  COLON, DESCENDING, BIOPSY:       Severe chronic active colitis.       No dysplasia identified.   I.  COLON, SIGMOID, BIOPSY:       Colonic mucosa with no diagnostic abnormality.       No dysplasia identified.   J.  COLON, RECTUM, BIOPSY:       Severe chronic active colitis.       No dysplasia identified.    EGD 08/27/2015: Procedure report unavailable.   Colonoscopy 08/27/2015: Procedure report not available   A.          Duodenum, BIOPSY:       Benign duodenal mucosa.  Negative for dysplasia and malignancy.   B.          Duodenal bulb, BIOPSY:       Benign duodenal mucosa.  Negative for dysplasia and malignancy   C.          STOMACH, BIOPSY:       Mild chronic inactive gastritis.  Negative for dysplasia and malignancy.  No Helicobacter pylori organisms are identified on H&E stain.   D.          Distal esophagus, BIOPSY:       Squamous mucosa with basal cell hyperplasia, focally  abundant intraepithelial eosinophils.  No intestinal metaplasia, dysplasia or malignancy identified.  See comment. In parts D and E the  esophageal squamous mucosa shows focal  increase in intraepithelial lymphocytes (up to 16 per HPF). The  findings are compatible with gastroesophageal reflux; however,  eosinophilic esophagitis is also in consideration.  E.          Proximal esophagus, BIOPSY:       Squamous mucosa with basal cell hyperplasia, focally  abundant intraepithelial eosinophils.  No intestinal metaplasia, dysplasia or malignancy identified.  See comment.   F.          TERIMINAL ILEUM, BIOPSY:       Small bowel mucosa with reactive lymphoid aggregates.  Negative for inflammation, dysplasia and malignancy.   G.          Cecum, BIOPSY:       Benign colonic mucosa.  Negative for inflammation, dysplasia and malignancy.   H.          Ascending colon, BIOPSY:       Focal acute colitis.  Negative for dysplasia and  malignancy.  See comment.   I.          Transverse colon, BIOPSY:       Focal acute colitis.  Negative for dysplasia and malignancy.    J.          Descending colon, BIOPSY:       Focal acute colitis.  Negative for dysplasia and malignancy.  See comment.   K.          Rectosigmoid, BIOPSY:       Focal acute colitis.  Negative for dysplasia and malignancy.  See comment.  CBC    Component Value Date/Time   WBC 11.2 (H) 05/25/2023 1600   RBC 4.26 05/25/2023 1600   HGB 13.2 05/25/2023 1600   HGB 16.4 12/23/2021 1519   HCT 37.7 (L) 05/25/2023 1600   HCT 46.0 12/23/2021 1519   PLT 331 05/25/2023 1600   PLT 327 12/23/2021 1519   MCV 88.5 05/25/2023 1600   MCV 90 12/23/2021 1519   MCH 31.0 05/25/2023 1600   MCHC 35.0 05/25/2023 1600   RDW 12.3 05/25/2023 1600   RDW 11.5 (L) 12/23/2021 1519   LYMPHSABS 2,531 05/25/2023 1600   LYMPHSABS 1.9 12/23/2021 1519   MONOABS 0.4 04/23/2023 1611   EOSABS 146 05/25/2023 1600   EOSABS 0.1 12/23/2021 1519   BASOSABS 45 05/25/2023 1600   BASOSABS 0.0 12/23/2021 1519   No results for input(s): HGB in the last 8760 hours.  CMP     Component  Value Date/Time   NA 139 04/23/2023 1611   K 3.7 04/23/2023 1611   CL 102 04/23/2023 1611   CO2 29 04/23/2023 1611   GLUCOSE 93 04/23/2023 1611   BUN 13 04/23/2023 1611   CREATININE 1.00 04/23/2023 1611   CALCIUM 9.7 04/23/2023 1611   PROT 7.4 04/23/2023 1611   ALBUMIN 4.8 04/23/2023 1611   AST 33 04/23/2023 1611   ALT 24 04/23/2023 1611   ALKPHOS 63 04/23/2023 1611   BILITOT 0.6 04/23/2023 1611   GFRNONAA NOT CALCULATED 08/16/2015 0920   GFRAA NOT CALCULATED 08/16/2015 0920      Latest Ref Rng & Units 04/23/2023    4:11 PM 08/16/2015    9:20 AM  Hepatic Function  Total Protein 6.0 - 8.3 g/dL 7.4  6.9   Albumin 3.5 - 5.2 g/dL 4.8  3.7   AST 0 - 37 U/L 33  26   ALT 0 - 53 U/L 24  18   Alk Phosphatase 39 - 117 U/L 63  217   Total Bilirubin 0.2 - 1.2 mg/dL 0.6  0.3       Current Medications:     Current Outpatient Medications (Respiratory):    albuterol  (VENTOLIN  HFA) 108 (90 Base) MCG/ACT inhaler, Inhale 2 puffs into the lungs every 6 (six) hours as needed for wheezing or shortness of breath.    Current Outpatient Medications (Other):    mesalamine  (CANASA ) 1000 MG suppository, Unwrap and insert 1 suppository (1,000 mg total) rectally at bedtime .   Na Sulfate-K Sulfate-Mg Sulfate concentrate (SUPREP BOWEL PREP KIT) 17.5-3.13-1.6 GM/177ML SOLN, Take 1 kit (354 mLs total) by mouth as directed.  Medical History:  Past Medical History:  Diagnosis Date   Anxiety    Asthma    Depression    Environmental allergies    IBS (irritable bowel syndrome)    OCD (obsessive compulsive disorder)    Scoliosis    Ulcerative colitis (HCC)    Allergies:  Allergies  Allergen Reactions  Gluten Meal     Other reaction(s): GI Upset (intolerance)   Milk (Cow)     Other reaction(s): Other (See Comments) Swelling (EOE)   Milk-Related Compounds    Other     Other reaction(s): Other (See Comments) Pt stated that he allergic to all preservatives   Shellfish-Derived Products      Other reaction(s): GI Upset (intolerance)   Soy Allergy (Obsolete)     Other reaction(s): GI Upset (intolerance)     Surgical History:  He  has a past surgical history that includes Colonoscopy, esophagogastroduodenoscopy (egd) and esophageal dilation and Colonoscopy with propofol  (N/A, 01/20/2022). Family History:  His family history includes Esophagitis in his paternal grandmother; Irritable bowel syndrome in his father and mother.  REVIEW OF SYSTEMS  : All other systems reviewed and negative except where noted in the History of Present Illness.  PHYSICAL EXAM: BP 102/70   Pulse 61   Ht 5' 9 (1.753 m)   Wt 138 lb 6 oz (62.8 kg)   BMI 20.43 kg/m  Physical Exam   GENERAL APPEARANCE: Well nourished, in no apparent distress. HEENT: No cervical lymphadenopathy, unremarkable thyroid, sclerae anicteric, conjunctiva pink. RESPIRATORY: Respiratory effort normal, breath sounds clear to auscultation bilaterally without rales, rhonchi, or wheezing. CARDIO: Regular rate and rhythm with no murmurs, rubs, or gallops, peripheral pulses intact. ABDOMEN: Soft, non-distended, active bowel sounds in all four quadrants, no tenderness to palpation, no rebound, no mass appreciated. RECTAL: Declines. MUSCULOSKELETAL: Full range of motion, normal gait, without edema. SKIN: Dry, intact without rashes or lesions. No jaundice. NEURO: Alert, oriented, no focal deficits. PSYCH: Cooperative, normal mood and affect.        Alan JONELLE Coombs, PA-C 9:47 AM

## 2024-06-23 ENCOUNTER — Other Ambulatory Visit (INDEPENDENT_AMBULATORY_CARE_PROVIDER_SITE_OTHER)

## 2024-06-23 ENCOUNTER — Encounter: Payer: Self-pay | Admitting: Physician Assistant

## 2024-06-23 ENCOUNTER — Ambulatory Visit
Admission: RE | Admit: 2024-06-23 | Discharge: 2024-06-23 | Disposition: A | Discharge status: Home / Self Care | Source: Ambulatory Visit | Attending: Physician Assistant | Admitting: Physician Assistant

## 2024-06-23 ENCOUNTER — Ambulatory Visit: Payer: Self-pay | Admitting: Physician Assistant

## 2024-06-23 ENCOUNTER — Ambulatory Visit: Admitting: Physician Assistant

## 2024-06-23 VITALS — BP 102/70 | HR 61 | Ht 69.0 in | Wt 138.4 lb

## 2024-06-23 DIAGNOSIS — M542 Cervicalgia: Secondary | ICD-10-CM

## 2024-06-23 DIAGNOSIS — K200 Eosinophilic esophagitis: Secondary | ICD-10-CM

## 2024-06-23 DIAGNOSIS — K5190 Ulcerative colitis, unspecified, without complications: Secondary | ICD-10-CM | POA: Diagnosis not present

## 2024-06-23 DIAGNOSIS — D509 Iron deficiency anemia, unspecified: Secondary | ICD-10-CM

## 2024-06-23 DIAGNOSIS — K219 Gastro-esophageal reflux disease without esophagitis: Secondary | ICD-10-CM | POA: Diagnosis not present

## 2024-06-23 LAB — IBC + FERRITIN
Ferritin: 98.6 ng/mL (ref 22.0–322.0)
Iron: 46 ug/dL (ref 42–165)
Saturation Ratios: 11.6 % — ABNORMAL LOW (ref 20.0–50.0)
TIBC: 396.2 ug/dL (ref 250.0–450.0)
Transferrin: 283 mg/dL (ref 212.0–360.0)

## 2024-06-23 LAB — BASIC METABOLIC PANEL WITH GFR
BUN: 11 mg/dL (ref 6–23)
CO2: 30 meq/L (ref 19–32)
Calcium: 9.5 mg/dL (ref 8.4–10.5)
Chloride: 102 meq/L (ref 96–112)
Creatinine, Ser: 0.95 mg/dL (ref 0.40–1.50)
GFR: 114.4 mL/min (ref >60.00)
Glucose, Bld: 85 mg/dL (ref 70–99)
Potassium: 4.4 meq/L (ref 3.5–5.1)
Sodium: 139 meq/L (ref 135–145)

## 2024-06-23 LAB — CBC WITH DIFFERENTIAL/PLATELET
Basophils Absolute: 0 K/uL (ref 0.0–0.1)
Basophils Relative: 0.7 % (ref 0.0–3.0)
Eosinophils Absolute: 0.3 K/uL (ref 0.0–0.7)
Eosinophils Relative: 6 % — ABNORMAL HIGH (ref 0.0–5.0)
HCT: 44.2 % (ref 39.0–52.0)
Hemoglobin: 15.2 g/dL (ref 13.0–17.0)
Lymphocytes Relative: 27.4 % (ref 12.0–46.0)
Lymphs Abs: 1.2 K/uL (ref 0.7–4.0)
MCHC: 34.3 g/dL (ref 30.0–36.0)
MCV: 92.1 fl (ref 78.0–100.0)
Monocytes Absolute: 0.4 K/uL (ref 0.1–1.0)
Monocytes Relative: 8.5 % (ref 3.0–12.0)
Neutro Abs: 2.6 K/uL (ref 1.4–7.7)
Neutrophils Relative %: 57.4 % (ref 43.0–77.0)
Platelets: 199 K/uL (ref 150.0–400.0)
RBC: 4.8 Mil/uL (ref 4.22–5.81)
RDW: 12.7 % (ref 11.5–15.5)
WBC: 4.5 K/uL (ref 4.0–10.5)

## 2024-06-23 LAB — HEPATIC FUNCTION PANEL
ALT: 15 U/L (ref 0–53)
AST: 20 U/L (ref 0–37)
Albumin: 4.8 g/dL (ref 3.5–5.2)
Alkaline Phosphatase: 55 U/L (ref 39–117)
Bilirubin, Direct: 0.1 mg/dL (ref 0.0–0.3)
Total Bilirubin: 0.6 mg/dL (ref 0.2–1.2)
Total Protein: 7.2 g/dL (ref 6.0–8.3)

## 2024-06-23 LAB — SEDIMENTATION RATE: Sed Rate: 7 mm/h (ref 0–15)

## 2024-06-23 LAB — HIGH SENSITIVITY CRP: CRP, High Sensitivity: 13.49 mg/L — ABNORMAL HIGH (ref 0.000–5.000)

## 2024-06-23 MED ORDER — NA SULFATE-K SULFATE-MG SULF 17.5-3.13-1.6 GM/177ML PO SOLN: 1.0000 | ORAL | 0 refills | Status: DC

## 2024-06-23 NOTE — Patient Instructions (Addendum)
 _______________________________________________________  If your blood pressure at your visit was 140/90 or greater, please contact your primary care physician to follow up on this.  _______________________________________________________  If you are age 21 or older, your body mass index should be between 23-30. Your Body mass index is 20.43 kg/m. If this is out of the aforementioned range listed, please consider follow up with your Primary Care Provider.  If you are age 21 or younger, your body mass index should be between 19-25. Your Body mass index is 20.43 kg/m. If this is out of the aformentioned range listed, please consider follow up with your Primary Care Provider.   ________________________________________________________  The [facility] providers would like to encourage you to use MYCHART to communicate with providers for non-urgent requests or questions.  Due to long hold times on the telephone, sending your provider a message by [facility] may be a faster and more efficient way to get a response.  Please allow 48 business hours for a response.  Please remember that this is for non-urgent requests.  _______________________________________________________  [facility] is using a team-based approach to care.  Your team is made up of your doctor and two to three APPS. Our APPS (Nurse Practitioners and Physician Assistants) work with your physician to ensure care continuity for you. They are fully qualified to address your health concerns and develop a treatment plan. They communicate directly with your gastroenterologist to care for you. Seeing the Advanced Practice Practitioners on your physician's team can help you by facilitating care more promptly, often allowing for earlier appointments, access to diagnostic testing, procedures, and other specialty referrals.   Your provider has requested that you go to the basement level for lab work before leaving today. Press B on the  elevator. The lab is located at the first door on the left as you exit the elevator. Your provider has requested that you have a x ray before leaving today. Please go to the basement floor to our Radiology department for the test.  You have been scheduled for an endoscopy and colonoscopy. Please follow the written instructions given to you at your visit today.  If you use inhalers (even only as needed), please bring them with you on the day of your procedure.  DO NOT TAKE 7 DAYS PRIOR TO TEST- Trulicity (dulaglutide) Ozempic, Wegovy (semaglutide) Mounjaro (tirzepatide) Bydureon Bcise (exanatide extended release)  DO NOT TAKE 1 DAY PRIOR TO YOUR TEST Rybelsus (semaglutide) Adlyxin (lixisenatide) Victoza (liraglutide) Byetta (exanatide) ___________________________________________________________________________  Due to recent changes in healthcare laws, you may see the results of your imaging and laboratory studies on MyChart before your provider has had a chance to review them.  We understand that in some cases there may be results that are confusing or concerning to you. Not all laboratory results come back in the same time frame and the provider may be waiting for multiple results in order to interpret others.  Please give us  48 hours in order for your provider to thoroughly review all the results before contacting the office for clarification of your results.    Please visit these websites below for more information: [email or web address]   Health Maintenance in Inflammatory Bowel Disease  Vaccines Inflammatory Bowel Disease (IBD) is often treated with immunomodulatory medications (6-mercaptopurine, azathioprine, methotrexate ), biologic therapies (adalimumab, certulizomab, natalizumab, infliximab, vedolizumab) or chronic steroids (at least 10mg  daily for 3 months), which suppress the immune system.  Patients receiving this type of  therapy are at higher risk to develop infections.  Many infections can be prevented by vaccinations. Inflammatory bowel disease within itself can cause inflammation and weaken the immune system.  Discuss vaccinations with your physician, but in general, IBD patients SHOULD receive the following vaccines:  Shingles vaccination Patient is at increased risk for immune compromised such as those with inflammatory bowel disease are able to get the Shingrix vaccine age 21 and up. 2 part shot, second shot between second and 49-month.  If you go over 6 months for the second shot have to restart series. Shingles is herpes virus dormant along the nerve root, can cause deafness, blindness and lasting pain.  Hepatitis A and B -We can test for this and able to get this in our office.   Human papilloma virus (HPV)  The HPV vaccine is recommended in females aged 21-45. It is a 2-dose vaccine.   The HPV vaccine is also now recommended in males aged 21-45  Influenza  All adults should get the influenza vaccine annually.  IBD patients on immunosuppressive therapy should NOT get the intranasal vaccine, as it is a live vaccine.  Pneumococcal  All adults 21 years or older should receive the Pneumococcal Conjugate Vaccine (PCV13), followed by the Pneumococcal Polysaccharide Vaccine (PPSV23) at least 1 year later.  In addition, patients over the age of 21 on immunosuppressive therapy should receive the PCV13, followed by PPSV23 no sooner than 8 weeks later.  A booster PPSV23 is also recommended 5 years later.  Finally, patients that smoke should get the PPSV23 if not otherwise vaccinated.  Tetanus, diphtheria, pertussis (Tdap/Td)  All adults should receive a 1-time Tdap.  A Td booster should be administered every 10 years.   Bone Health Patients with IBD can have markedly lower bone mineral densities than patients without IBD. Low bone mineral density is associated with fractures. Therefore, screening for  bone disease, such as osteoporosis and osteopenia, is important in certain populations.  The American Gastroenterology Association (AGA) and the American College of Gastroenterology [facility]) recommend dual-energy x-ray absorptiometry scanning (DEXA) in postmenopausal women, men over the age of 26, patients with prolonged corticosteroid use (greater than 3 consecutive months or recurrent courses), patients with a personal history of low trauma fracture and patients with hypogonadism.   Tobacco Cessation Smoking worsens Crohn's disease. In addition, smoking is associated with many known cardiac, pulmonary and oncologic risks.   If you need help with quitting smoking, please talk to your doctor and call 1-800-QUIT NOW.  Cancer Screening IBD patients on immunosuppressive therapy may be more susceptible to certain types of cancer. However, specific evidence is conflicting. Therefore, patients with IBD should undergo age- and sex-specific cancer screening. Please discuss this important issue with your primary care provider.  Colon Cancer  Patients with either Ulcerative Colitis (UC) or Crohn's disease involving the colon should have colon cancer screening 8-10 years after initial diagnosis. After that time, the frequency of surveillance colonocoscopy will be determined by your gastroenterologist, but will usually occur every 1-2 years.   Patients with IBD and Primary Sclerosing Cholangitis (PSC) need a screening colonoscopy at the time of diagnosis of PSC and annually thereafter.   Thank you for entrusting me with your care and choosing [facility].  Alan Coombs, PA-C

## 2024-06-27 LAB — HEPATITIS B SURFACE ANTIGEN: Hepatitis B Surface Ag: NONREACTIVE

## 2024-06-27 LAB — HLA-B27 ANTIGEN: HLA-B27 Antigen: NEGATIVE

## 2024-06-27 LAB — QUANTIFERON-TB GOLD PLUS

## 2024-06-29 ENCOUNTER — Other Ambulatory Visit: Payer: Self-pay

## 2024-06-29 DIAGNOSIS — K5190 Ulcerative colitis, unspecified, without complications: Secondary | ICD-10-CM

## 2024-07-06 NOTE — Progress Notes (Signed)
 Agree with the assessment and plan as outlined by Quentin Mulling, PA-C. ? ?Keron Neenan, DO, FACG ? ?

## 2024-07-21 ENCOUNTER — Encounter: Payer: Self-pay | Admitting: Gastroenterology

## 2024-07-28 ENCOUNTER — Ambulatory Visit (AMBULATORY_SURGERY_CENTER): Admitting: Gastroenterology

## 2024-07-28 ENCOUNTER — Encounter: Payer: Self-pay | Admitting: Gastroenterology

## 2024-07-28 VITALS — BP 108/55 | HR 85 | Temp 98.6°F | Resp 12 | Ht 69.0 in | Wt 138.0 lb

## 2024-07-28 DIAGNOSIS — K219 Gastro-esophageal reflux disease without esophagitis: Secondary | ICD-10-CM

## 2024-07-28 DIAGNOSIS — K5190 Ulcerative colitis, unspecified, without complications: Secondary | ICD-10-CM

## 2024-07-28 DIAGNOSIS — K200 Eosinophilic esophagitis: Secondary | ICD-10-CM | POA: Diagnosis not present

## 2024-07-28 DIAGNOSIS — Z860101 Personal history of adenomatous and serrated colon polyps: Secondary | ICD-10-CM | POA: Diagnosis not present

## 2024-07-28 DIAGNOSIS — F32A Depression, unspecified: Secondary | ICD-10-CM | POA: Diagnosis not present

## 2024-07-28 DIAGNOSIS — D509 Iron deficiency anemia, unspecified: Secondary | ICD-10-CM

## 2024-07-28 DIAGNOSIS — K2980 Duodenitis without bleeding: Secondary | ICD-10-CM

## 2024-07-28 DIAGNOSIS — F419 Anxiety disorder, unspecified: Secondary | ICD-10-CM | POA: Diagnosis not present

## 2024-07-28 DIAGNOSIS — K2960 Other gastritis without bleeding: Secondary | ICD-10-CM | POA: Diagnosis not present

## 2024-07-28 DIAGNOSIS — K629 Disease of anus and rectum, unspecified: Secondary | ICD-10-CM | POA: Diagnosis not present

## 2024-07-28 DIAGNOSIS — K2289 Other specified disease of esophagus: Secondary | ICD-10-CM | POA: Diagnosis not present

## 2024-07-28 DIAGNOSIS — J45909 Unspecified asthma, uncomplicated: Secondary | ICD-10-CM | POA: Diagnosis not present

## 2024-07-28 MED ORDER — SODIUM CHLORIDE 0.9 % IV SOLN: 500.0000 mL | INTRAVENOUS | Status: DC

## 2024-07-28 NOTE — Op Note (Signed)
 [facility] Patient Name: Christopher Clarke Procedure Date: 07/28/2024 2:07 PM MRN: [id number] Endoscopist: Sandor Flatter , MD, [id number] Age: 23 Referring MD:  Date of Birth: 2001-03-20 Gender: Male Account #: [id number] Procedure:                Colonoscopy Indications:              Follow-up of ulcerative colitis                           Currently taking Canasa  suppositories only and no                            active symptoms. Medicines:                Monitored Anesthesia Care Procedure:                Pre-Anesthesia Assessment:                           - Prior to the procedure, a History and Physical                            was performed, and patient medications and                            allergies were reviewed. The patient's tolerance of                            previous anesthesia was also reviewed. The risks                            and benefits of the procedure and the sedation                            options and risks were discussed with the patient.                            All questions were answered, and informed consent                            was obtained. Prior Anticoagulants: The patient has                            taken no anticoagulant or antiplatelet agents. ASA                            Grade Assessment: II - A patient with mild systemic                            disease. After reviewing the risks and benefits,                            the patient was deemed in satisfactory condition to  undergo the procedure.                           After obtaining informed consent, the colonoscope                            was passed under direct vision. Throughout the                            procedure, the patient's blood pressure, pulse, and                            oxygen saturations were monitored continuously. The                            Colonoscope was introduced through the anus and                             advanced to the the cecum, identified by                            appendiceal orifice and ileocecal valve. The                            colonoscopy was performed without difficulty. The                            patient tolerated the procedure well. The quality                            of the bowel preparation was fair. The ileocecal                            valve, appendiceal orifice, and rectum were                            photographed. Scope In: 2:35:07 PM Scope Out: 2:51:05 PM Scope Withdrawal Time: 0 hours 9 minutes 45 seconds  Total Procedure Duration: 0 hours 15 minutes 58 seconds  Findings:                 The perianal and digital rectal examinations were                            normal.                           There were a few areas of localized, mild                            inflammation characterized by congestion (edema)                            and erythema found in the sigmoid colon and in the  transverse colon. Biopsies were taken with a cold                            forceps for histology. Estimated blood loss was                            minimal.                           A moderate amount of semi-liquid stool was found in                            the entire colon, interfering with visualization.                            Lavage of the area was performed using copious                            amounts of sterile water, resulting in incomplete                            clearance with fair visualization.                           The visualized mucosa was otherwise normal                            throughout the remainder of the colon (albeit on                            significanly limited views). Biopsies were taken                            throughout the colon.                           The retroflexed view of the distal rectum and anal                            verge was normal and showed no anal  or rectal                            abnormalities. Complications:            No immediate complications. Estimated Blood Loss:     Estimated blood loss was minimal. Impression:               - Preparation of the colon was fair.                           - Localized mild inflammation was found in the                            sigmoid colon and in the transverse colon. Biopsied.                           -  Stool in the entire examined colon.                           - Normal mucosa in the entire examined colon.                           - The distal rectum and anal verge are normal on                            retroflexion view.                           - A few areas of focal, mild inflammation in the                            transverse colon and sigmoid colon, characterized                            by mild edema and erythema. The surrounding mucosa                            was normal-appearing. This was biopsied. There was                            retained stool throughout the colon which                            significantly limited views. The visualized mucosal                            surface was otherwise normal-appearing and biopsies                            were taken throughout the colon. Recommendation:           - Patient has a contact number available for                            emergencies. The signs and symptoms of potential                            delayed complications were discussed with the                            patient. Return to normal activities tomorrow.                            Written discharge instructions were provided to the                            patient.                           - Resume previous diet.                           -  Continue present medications.                           - Await pathology results.                           - Repeat colonoscopy in 1 year because the bowel                            preparation  was suboptimal and for surveillance.                           - Return to GI office in 3 months. Sandor Flatter, MD 07/28/2024 3:07:58 PM

## 2024-07-28 NOTE — Progress Notes (Signed)
 Sedate, gd SR, tolerated procedure well, VSS, report to RN

## 2024-07-28 NOTE — Progress Notes (Signed)
 GASTROENTEROLOGY PROCEDURE H&P NOTE   Primary Care Physician: Dorina Loving, PA-C    Reason for Procedure:  Ulcerative Colitis, history of colon polyps, GERD, eosinophilic esophagitis  Plan:    EGD, colonoscopy  Patient is appropriate for endoscopic procedure(s) in the ambulatory ([facility]) setting.  The nature of the procedure, as well as the risks, benefits, and alternatives were carefully and thoroughly reviewed with the patient. Ample time for discussion and questions allowed. The patient understood, was satisfied, and agreed to proceed.     HPI: Christopher Clarke is a 23 y.o. male who presents for EGD for evaluation of GERD with overlapping EOE along with colonoscopy for UC surveillance.  UC currently well-controlled with nightly Canasa  suppositories.  History of EOE and previously treated with Prevacid, but he has since discontinued.  Now manages with dietary modification alone.  No recent dysphagia.  Some intermittent reflux symptoms.  Otherwise, no significant changes in clinical history since last OV on 06/23/2024.  Labs reviewed from last month and showed normal CBC, CMP, ESR.  CRP elevated at 13.5.  Calprotectin ordered but not yet done.  HLA-B27 negative.  Labs with slight iron deficiency with iron sat 11.6%, but remainder of iron indices were normal and no concomitant anemia or microcytosis.  RELEVANT GI HISTORY, LABS, IMAGING: Colonoscopy 01/20/2022 by Dr. Ruel Kung:  - Preparation of the colon was fair.  - One 8 mm polyp in the ascending colon, removed with a cold snare. Resected and retrieved.  - Localized moderate inflammation was found in the distal rectum secondary to colitis. Biopsied.  - The examination was otherwise normal on direct and retroflexion views. -Recall colonoscopy 7 years  A.  COLON, RIGHT, BIOPSIES: - MULTIPLE FRAGMENTS OF COLONIC MUCOSA WITH MILD NONSPECIFIC EDEMA. - FEATURES OF A SPECIFIC COLITIS ARE NOT IDENTIFIED. - NO SIGNIFICANT ATYPIA OR  MALIGNANCY.  B.  COLON, ASCENDING, POLYP; COLD SNARE BIOPSIES: - SESSILE SERRATED POLYP.  C.  COLON, TRANSVERSE;, BIOPSIES: - MULTIPLE FRAGMENTS OF COLONIC MUCOSA WITH MILD NONSPECIFIC EDEMA. - FEATURES OF A SPECIFIC COLITIS ARE NOT IDENTIFIED. - NO SIGNIFICANT ATYPIA OR MALIGNANCY.  D.  COLON, LEFT; BIOPSIES: - COLONIC MUCOSA WITH NO SPECIFIC HISTOLOGIC ABNORMALITY. - -NO SIGNIFICANT ATYPIA OR MALIGNANCY.  E.  RECTUM; BIOPSIES: - RECTAL MUCOSA WITH MILD ARCHITECTURAL DISTORTION (SEE COMMENT). - NO EVIDENCE OF ACTIVE COLITIS. -NO SIGNIFICANT ATYPIA OR MALIGNANCY.  Comment: The rectal biopsy shows some crypt shortening and crypt dropout- the features are suggestive of quiescent colitis.   Colonoscopy 02/19/2020 [facility]: Showed ascending colon biopsies with no inflammation, transverse colon biopsies with no inflammation, descending colon biopsies no inflammation, sigmoid colon biopsies with no inflammation but rectal biopsy showed moderate chronic active colitis.   EGD 03/09/2017: Procedure report not available   Colonoscopy 03/09/2017: Procedure report not available   A.  DUODENUM, BIOPSY:       Duodenal mucosa with no diagnostic abnormality.       No villous blunting or increased intraepithelial  lymphocytes.   B. STOMACH, BIOPSY:       Mild chronic inactive gastritis.   C.  ESOPHAGUS, DISTAL, BIOPSY:       Squamous mucosa with up to 6 eosinophils per HPF.   D.  ESOPHAGUS, MID, BIOSPY:       Squamous mucosa with no diagnostic abnormality.   E.  TERMINAL ILLEUM, BIOSPY:       Ileal mucosa with no diagnostic abnormality.   F.  COLON, ASCENDING, BIOPSY:  Focal active colitis.       No dysplasia identified.   G.  COLON, TRANSVERSE, BIOPSY:       Colonic mucosa with no diagnostic abnormality.       No dysplasia identified.   H.  COLON, DESCENDING, BIOPSY:       Severe chronic active colitis.       No dysplasia identified.   I.  COLON, SIGMOID,  BIOPSY:       Colonic mucosa with no diagnostic abnormality.       No dysplasia identified.   J.  COLON, RECTUM, BIOPSY:       Severe chronic active colitis.       No dysplasia identified.    EGD 08/27/2015: Procedure report unavailable.   Colonoscopy 08/27/2015: Procedure report not available   A.          Duodenum, BIOPSY:       Benign duodenal mucosa.  Negative for dysplasia and malignancy.   B.          Duodenal bulb, BIOPSY:       Benign duodenal mucosa.  Negative for dysplasia and malignancy   C.          STOMACH, BIOPSY:       Mild chronic inactive gastritis.  Negative for dysplasia and malignancy.  No Helicobacter pylori organisms are identified on H&E stain.   D.          Distal esophagus, BIOPSY:       Squamous mucosa with basal cell hyperplasia, focally  abundant intraepithelial eosinophils.  No intestinal metaplasia, dysplasia or malignancy identified.  See comment. In parts D and E the esophageal squamous mucosa shows focal  increase in intraepithelial lymphocytes (up to 16 per HPF). The  findings are compatible with gastroesophageal reflux; however,  eosinophilic esophagitis is also in consideration.  E.          Proximal esophagus, BIOPSY:       Squamous mucosa with basal cell hyperplasia, focally  abundant intraepithelial eosinophils.  No intestinal metaplasia, dysplasia or malignancy identified.  See comment.   F.          TERIMINAL ILEUM, BIOPSY:       Small bowel mucosa with reactive lymphoid aggregates.  Negative for inflammation, dysplasia and malignancy.   G.          Cecum, BIOPSY:       Benign colonic mucosa.  Negative for inflammation, dysplasia and malignancy.   H.          Ascending colon, BIOPSY:       Focal acute colitis.  Negative for dysplasia and malignancy.  See comment.   I.          Transverse colon, BIOPSY:       Focal acute colitis.  Negative for dysplasia and malignancy.    J.          Descending colon, BIOPSY:        Focal acute colitis.  Negative for dysplasia and malignancy.  See comment.   K.          Rectosigmoid, BIOPSY:       Focal acute colitis.  Negative for dysplasia and malignancy.  See comment.   Past Medical History:  Diagnosis Date   Anxiety    Asthma    Depression    Environmental allergies    IBS (irritable bowel syndrome)    OCD (obsessive compulsive disorder)    Scoliosis    Ulcerative colitis (  [facility])     Past Surgical History:  Procedure Laterality Date   COLONOSCOPY WITH PROPOFOL  N/A 01/20/2022   Procedure: COLONOSCOPY WITH PROPOFOL ;  Surgeon: Therisa Bi, MD;  Location: [facility] ENDOSCOPY;  Service: Gastroenterology;  Laterality: N/A;   COLONOSCOPY, ESOPHAGOGASTRODUODENOSCOPY (EGD) AND ESOPHAGEAL DILATION      Prior to Admission medications   Medication Sig Start Date End Date Taking? Authorizing Provider  albuterol  (VENTOLIN  HFA) 108 (90 Base) MCG/ACT inhaler Inhale 2 puffs into the lungs every 6 (six) hours as needed for wheezing or shortness of breath. 05/25/23   Saguier, [place], PA-C  mesalamine  (CANASA ) 1000 MG suppository Unwrap and insert 1 suppository (1,000 mg total) rectally at bedtime . 10/30/22   Anna, Kiran, MD  Na Sulfate-K Sulfate-Mg Sulfate concentrate (SUPREP BOWEL PREP KIT) 17.5-3.13-1.6 GM/177ML SOLN Take 1 kit (354 mLs total) by mouth as directed. 06/23/24   Craig Alan SAUNDERS, PA-C    Current Outpatient Medications  Medication Sig Dispense Refill   albuterol  (VENTOLIN  HFA) 108 (90 Base) MCG/ACT inhaler Inhale 2 puffs into the lungs every 6 (six) hours as needed for wheezing or shortness of breath. 6.7 g 5   mesalamine  (CANASA ) 1000 MG suppository Unwrap and insert 1 suppository (1,000 mg total) rectally at bedtime . 90 suppository 1   Na Sulfate-K Sulfate-Mg Sulfate concentrate (SUPREP BOWEL PREP KIT) 17.5-3.13-1.6 GM/177ML SOLN Take 1 kit (354 mLs total) by mouth as directed. 324 mL 0   No current facility-administered medications for this visit.     Allergies as of 07/28/2024 - Review Complete 06/23/2024  Allergen Reaction Noted   Gluten meal  09/15/2016   Milk (cow)  09/15/2016   Milk-related compounds  01/02/2018   Other  10/25/2020   Shellfish protein-containing drug products  09/15/2021   Soy allergy (obsolete)  09/15/2021    Family History  Problem Relation Age of Onset   Irritable bowel syndrome Mother    Irritable bowel syndrome Father    Esophagitis Paternal Grandmother    Liver disease Neg Hx    Esophageal cancer Neg Hx    Colon cancer Neg Hx     Social History   Socioeconomic History   Marital status: Single    Spouse name: Not on file   Number of children: 0   Years of education: Not on file   Highest education level: Associate degree: academic program  Occupational History   Occupation: eletriction  Tobacco Use   Smoking status: Never   Smokeless tobacco: Never  Vaping Use   Vaping status: Never Used  Substance and Sexual Activity   Alcohol use: Never   Drug use: Never   Sexual activity: Not on file  Other Topics Concern   Not on file  Social History Narrative   Not on file   Social Drivers of Health   Financial Resource Strain: High Risk (05/22/2023)   Overall Financial Resource Strain (CARDIA)    Difficulty of Paying Living Expenses: Hard  Food Insecurity: No Food Insecurity (05/22/2023)   Hunger Vital Sign    Worried About Running Out of Food in the Last Year: Never true    Ran Out of Food in the Last Year: Never true  Transportation Needs: No Transportation Needs (05/22/2023)   PRAPARE - Administrator, Civil Service (Medical): No    Lack of Transportation (Non-Medical): No  Physical Activity: Sufficiently Active (05/22/2023)   Exercise Vital Sign    Days of Exercise per Week: 6 days    Minutes of Exercise  per Session: 150+ min  Stress: Stress Concern Present (05/22/2023)   [employer] of Occupational Health - Occupational Stress Questionnaire    Feeling of Stress :  To some extent  Social Connections: Moderately Integrated (05/22/2023)   Social Connection and Isolation Panel    Frequency of Communication with Friends and Family: More than three times a week    Frequency of Social Gatherings with Friends and Family: More than three times a week    Attends Religious Services: More than 4 times per year    Active Member of [employer] or Organizations: Yes    Attends Engineer, Structural: More than 4 times per year    Marital Status: Never married  Intimate Partner Violence: Not on file    Physical Exam: Vital signs in last 24 hours: @There  were no vitals taken for this visit. GEN: NAD EYE: Sclerae anicteric ENT: MMM CV: Non-tachycardic Pulm: CTA b/l GI: Soft, NT/ND NEURO:  Alert & Oriented x 3   Sandor Flatter, DO [facility]   07/28/2024 12:56 PM

## 2024-07-28 NOTE — Progress Notes (Signed)
 Called to room to assist during endoscopic procedure.  Patient ID and intended procedure confirmed with present staff. Received instructions for my participation in the procedure from the performing physician.

## 2024-07-28 NOTE — Patient Instructions (Signed)
 Resume previous diet. Continue present medications.  Await pathology results. Repeat colonoscopy in 1 year because the bowel preparation was suboptimal and for surveillance. Return to GI office on 3 months.  YOU HAD AN ENDOSCOPIC PROCEDURE TODAY AT THE [facility]:   Refer to the procedure report that was given to you for any specific questions about what was found during the examination.  If the procedure report does not answer your questions, please call your gastroenterologist to clarify.  If you requested that your care partner not be given the details of your procedure findings, then the procedure report has been included in a sealed envelope for you to review at your convenience later.  YOU SHOULD EXPECT: Some feelings of bloating in the abdomen. Passage of more gas than usual.  Walking can help get rid of the air that was put into your GI tract during the procedure and reduce the bloating. If you had a lower endoscopy (such as a colonoscopy or flexible sigmoidoscopy) you may notice spotting of blood in your stool or on the toilet paper. If you underwent a bowel prep for your procedure, you may not have a normal bowel movement for a few days.  Please Note:  You might notice some irritation and congestion in your nose or some drainage.  This is from the oxygen used during your procedure.  There is no need for concern and it should clear up in a day or so.  SYMPTOMS TO REPORT IMMEDIATELY:  Following lower endoscopy (colonoscopy or flexible sigmoidoscopy):  Excessive amounts of blood in the stool  Significant tenderness or worsening of abdominal pains  Swelling of the abdomen that is new, acute  Fever of 100F or higher  Following upper endoscopy (EGD)  Vomiting of blood or coffee ground material  New chest pain or pain under the shoulder blades  Painful or persistently difficult swallowing  New shortness of breath  Fever of 100F or higher  Black, tarry-looking  stools  For urgent or emergent issues, a gastroenterologist can be reached at any hour by calling [phone]. Do not use MyChart messaging for urgent concerns.    DIET:  We do recommend a small meal at first, but then you may proceed to your regular diet.  Drink plenty of fluids but you should avoid alcoholic beverages for 24 hours.  ACTIVITY:  You should plan to take it easy for the rest of today and you should NOT DRIVE or use heavy machinery until tomorrow (because of the sedation medicines used during the test).    FOLLOW UP: Our staff will call the number listed on your records the next business day following your procedure.  We will call around 7:15- 8:00 am to check on you and address any questions or concerns that you may have regarding the information given to you following your procedure. If we do not reach you, we will leave a message.     If any biopsies were taken you will be contacted by phone or by letter within the next 1-3 weeks.  Please call us  at [phone] if you have not heard about the biopsies in 3 weeks.    SIGNATURES/CONFIDENTIALITY: You and/or your care partner have signed paperwork which will be entered into your electronic medical record.  These signatures attest to the fact that that the information above on your After Visit Summary has been reviewed and is understood.  Full responsibility of the confidentiality of this discharge information lies with you and/or your  care-partner.

## 2024-07-28 NOTE — Op Note (Addendum)
 [facility] Patient Name: Christopher Clarke Procedure Date: 07/28/2024 2:13 PM MRN: [id number] Endoscopist: Sandor Flatter , MD, [id number] Age: 23 Referring MD:  Date of Birth: 15-Oct-2000 Gender: Male Account #: [id number] Procedure:                Upper GI endoscopy Indications:              Iron deficiency without anemia, Suspected                            esophageal reflux, Follow-up of eosinophilic                            esophagitis                           Not currently taking anything for EOE or reflux. Medicines:                Monitored Anesthesia Care Procedure:                Pre-Anesthesia Assessment:                           - Prior to the procedure, a History and Physical                            was performed, and patient medications and                            allergies were reviewed. The patient's tolerance of                            previous anesthesia was also reviewed. The risks                            and benefits of the procedure and the sedation                            options and risks were discussed with the patient.                            All questions were answered, and informed consent                            was obtained. Prior Anticoagulants: The patient has                            taken no anticoagulant or antiplatelet agents. ASA                            Grade Assessment: II - A patient with mild systemic                            disease. After reviewing the risks and benefits,  the patient was deemed in satisfactory condition to                            undergo the procedure.                           After obtaining informed consent, the endoscope was                            passed under direct vision. Throughout the                            procedure, the patient's blood pressure, pulse, and                            oxygen saturations were monitored continuously. The                             Olympus scope [id number] was introduced through the                            mouth, and advanced to the second part of duodenum.                            The upper GI endoscopy was accomplished without                            difficulty. The patient tolerated the procedure                            well. Scope In: Scope Out: Findings:                 The examined esophagus was normal. Biopsies were                            obtained from the proximal and distal esophagus                            with cold forceps for histology of suspected                            eosinophilic esophagitis.                           The Z-line was regular and was found 38 cm from the                            incisors.                           The entire examined stomach was normal. Biopsies                            were taken with a cold forceps for Helicobacter  pylori testing. Estimated blood loss was minimal.                           The examined duodenum was normal. Biopsies were                            taken with a cold forceps for histology. Estimated                            blood loss was minimal. Complications:            No immediate complications. Estimated Blood Loss:     Estimated blood loss was minimal. Impression:               - Normal esophagus. Biopsies were taken with a cold                            forceps for evaluation of eosinophilic esophagitis.                           - Z-line regular, 38 cm from the incisors.                           - Normal stomach. Biopsied.                           - Normal examined duodenum. Biopsied. Recommendation:           - Patient has a contact number available for                            emergencies. The signs and symptoms of potential                            delayed complications were discussed with the                            patient. Return to normal activities  tomorrow.                            Written discharge instructions were provided to the                            patient.                           - Resume previous diet.                           - Continue present medications.                           - Await pathology results. Sandor Flatter, MD 07/28/2024 3:00:32 PM

## 2024-07-31 ENCOUNTER — Telehealth: Payer: Self-pay

## 2024-07-31 NOTE — Telephone Encounter (Signed)
 Left message on answering machine.

## 2024-08-03 ENCOUNTER — Ambulatory Visit: Payer: Self-pay | Admitting: Gastroenterology

## 2024-08-03 DIAGNOSIS — K5190 Ulcerative colitis, unspecified, without complications: Secondary | ICD-10-CM

## 2024-08-03 LAB — SURGICAL PATHOLOGY

## 2024-08-07 MED ORDER — LANSOPRAZOLE 30 MG PO CPDR: 30.0000 mg | DELAYED_RELEASE_CAPSULE | Freq: Every day | ORAL | 3 refills | Status: DC

## 2024-08-17 ENCOUNTER — Telehealth

## 2024-08-17 DIAGNOSIS — J020 Streptococcal pharyngitis: Secondary | ICD-10-CM | POA: Diagnosis not present

## 2024-08-18 MED ORDER — AMOXICILLIN 500 MG PO CAPS: 500.0000 mg | ORAL_CAPSULE | Freq: Two times a day (BID) | ORAL | 0 refills | Status: AC

## 2024-08-18 NOTE — Progress Notes (Signed)

## 2024-09-02 ENCOUNTER — Other Ambulatory Visit: Payer: Self-pay | Admitting: Gastroenterology

## 2024-09-06 ENCOUNTER — Other Ambulatory Visit (INDEPENDENT_AMBULATORY_CARE_PROVIDER_SITE_OTHER)

## 2024-09-06 DIAGNOSIS — K5190 Ulcerative colitis, unspecified, without complications: Secondary | ICD-10-CM | POA: Diagnosis not present

## 2024-09-06 LAB — SEDIMENTATION RATE: Sed Rate: 1 mm/h (ref 0–15)

## 2024-09-06 LAB — C-REACTIVE PROTEIN: CRP: <0.5 mg/dL (ref 0.5–20.0)

## 2024-09-08 ENCOUNTER — Ambulatory Visit: Payer: Self-pay | Admitting: Gastroenterology

## 2024-09-08 LAB — QUANTIFERON-TB GOLD PLUS
Mitogen-NIL: 8.16 [IU]/mL
NIL: 0.01 [IU]/mL
QuantiFERON-TB Gold Plus: NEGATIVE
TB1-NIL: 0.01 [IU]/mL
TB2-NIL: 0.02 [IU]/mL

## 2024-09-11 ENCOUNTER — Ambulatory Visit: Payer: Self-pay | Admitting: Physician Assistant

## 2024-09-11 MED ORDER — MESALAMINE 1000 MG RE SUPP
1000.0000 mg | Freq: Every day | RECTAL | 1 refills | Status: DC
  Filled 2024-09-11: qty 90, 90d supply, fill #0

## 2024-09-12 ENCOUNTER — Other Ambulatory Visit (HOSPITAL_COMMUNITY): Payer: Self-pay

## 2024-09-13 ENCOUNTER — Encounter: Payer: Self-pay | Admitting: Pharmacist

## 2024-09-13 ENCOUNTER — Other Ambulatory Visit: Payer: Self-pay

## 2024-09-14 ENCOUNTER — Other Ambulatory Visit: Payer: Self-pay

## 2024-09-14 ENCOUNTER — Telehealth: Payer: Self-pay | Admitting: Physician Assistant

## 2024-09-14 MED ORDER — LANSOPRAZOLE 30 MG PO CPDR: 30.0000 mg | DELAYED_RELEASE_CAPSULE | Freq: Every day | ORAL | 3 refills | Status: AC

## 2024-09-14 MED ORDER — MESALAMINE 1000 MG RE SUPP: 1000.0000 mg | Freq: Every day | RECTAL | 1 refills | Status: AC

## 2024-09-14 NOTE — Telephone Encounter (Signed)
 Medications sent to [facility] in [place], [place] as requested.  Patient wife aware.

## 2024-09-14 NOTE — Telephone Encounter (Signed)
 Patient's wife called wishing for prescriptions that were sent in to be sent to the [facility] in [place], [place] instead. Please advise, thank you

## 2024-09-14 NOTE — Telephone Encounter (Signed)
 Opened in error

## 2024-09-16 ENCOUNTER — Other Ambulatory Visit (HOSPITAL_BASED_OUTPATIENT_CLINIC_OR_DEPARTMENT_OTHER): Payer: Self-pay

## 2024-09-16 ENCOUNTER — Other Ambulatory Visit (HOSPITAL_COMMUNITY): Payer: Self-pay

## 2024-10-25 ENCOUNTER — Ambulatory Visit: Admitting: Gastroenterology

## 2024-11-24 ENCOUNTER — Ambulatory Visit: Admitting: Gastroenterology

## 2026-10-25 ENCOUNTER — Ambulatory Visit: Admitting: Gastroenterology
